# Patient Record
Sex: Male | Born: 1975 | Race: White | Hispanic: No | Smoking: Current every day smoker
Health system: Southern US, Community
[De-identification: ages and names within clinical notes are randomized; demographics above are authoritative.]

## PROBLEM LIST (undated history)

## (undated) DIAGNOSIS — I10 Essential (primary) hypertension: Secondary | ICD-10-CM

## (undated) DIAGNOSIS — I639 Cerebral infarction, unspecified: Secondary | ICD-10-CM

---

## 2012-10-07 ENCOUNTER — Other Ambulatory Visit: Payer: Self-pay | Admitting: Physician Assistant

## 2013-06-16 ENCOUNTER — Emergency Department: Payer: Self-pay | Admitting: Emergency Medicine

## 2013-06-16 LAB — URINALYSIS, COMPLETE
BACTERIA: NONE SEEN
Bilirubin,UR: NEGATIVE
Blood: NEGATIVE
GLUCOSE, UR: NEGATIVE mg/dL (ref 0–75)
Leukocyte Esterase: NEGATIVE
Nitrite: NEGATIVE
PROTEIN: NEGATIVE
Ph: 5 (ref 4.5–8.0)
RBC,UR: 1 /HPF (ref 0–5)
Specific Gravity: 1.028 (ref 1.003–1.030)
Squamous Epithelial: NONE SEEN
WBC UR: 1 /HPF (ref 0–5)

## 2013-06-16 LAB — COMPREHENSIVE METABOLIC PANEL
ALT: 26 U/L (ref 12–78)
ANION GAP: 8 (ref 7–16)
Albumin: 4.8 g/dL (ref 3.4–5.0)
Alkaline Phosphatase: 69 U/L
BUN: 12 mg/dL (ref 7–18)
Bilirubin,Total: 0.6 mg/dL (ref 0.2–1.0)
CHLORIDE: 101 mmol/L (ref 98–107)
Calcium, Total: 9.5 mg/dL (ref 8.5–10.1)
Co2: 22 mmol/L (ref 21–32)
Creatinine: 1.07 mg/dL (ref 0.60–1.30)
EGFR (African American): >60
Glucose: 104 mg/dL — ABNORMAL HIGH (ref 65–99)
Osmolality: 263 (ref 275–301)
Potassium: 4.7 mmol/L (ref 3.5–5.1)
SGOT(AST): 23 U/L (ref 15–37)
SODIUM: 131 mmol/L — AB (ref 136–145)
Total Protein: 9.1 g/dL — ABNORMAL HIGH (ref 6.4–8.2)

## 2013-06-16 LAB — CBC
HCT: 48 % (ref 40.0–52.0)
HGB: 16.6 g/dL (ref 13.0–18.0)
MCH: 31.7 pg (ref 26.0–34.0)
MCHC: 34.6 g/dL (ref 32.0–36.0)
MCV: 92 fL (ref 80–100)
Platelet: 332 10*3/uL (ref 150–440)
RBC: 5.24 10*6/uL (ref 4.40–5.90)
RDW: 13.7 % (ref 11.5–14.5)
WBC: 11.1 10*3/uL — AB (ref 3.8–10.6)

## 2013-06-16 LAB — PROTIME-INR
INR: 1
PROTHROMBIN TIME: 13.1 s (ref 11.5–14.7)

## 2013-06-16 LAB — LIPASE, BLOOD: LIPASE: 75 U/L (ref 73–393)

## 2013-07-05 ENCOUNTER — Ambulatory Visit
Admission: RE | Admit: 2013-07-05 | Discharge: 2013-07-05 | Disposition: A | Discharge status: Home / Self Care | Payer: No Typology Code available for payment source | Source: Ambulatory Visit | Attending: *Deleted | Admitting: *Deleted

## 2013-07-05 ENCOUNTER — Other Ambulatory Visit: Payer: Self-pay | Admitting: *Deleted

## 2013-07-05 DIAGNOSIS — R7611 Nonspecific reaction to tuberculin skin test without active tuberculosis: Secondary | ICD-10-CM

## 2018-03-18 ENCOUNTER — Encounter (HOSPITAL_COMMUNITY): Payer: Self-pay

## 2018-03-18 ENCOUNTER — Inpatient Hospital Stay (HOSPITAL_COMMUNITY)
Admission: AD | Admit: 2018-03-18 | Discharge: 2018-03-19 | DRG: 064 | Disposition: A | Discharge status: Home / Self Care | Payer: 59 | Source: Other Acute Inpatient Hospital | Attending: Neurology | Admitting: Neurology

## 2018-03-18 ENCOUNTER — Other Ambulatory Visit: Payer: Self-pay

## 2018-03-18 ENCOUNTER — Ambulatory Visit (HOSPITAL_COMMUNITY)
Admission: RE | Admit: 2018-03-18 | Discharge: 2018-03-18 | Disposition: A | Discharge status: Home / Self Care | Payer: 59 | Source: Ambulatory Visit | Attending: Student in an Organized Health Care Education/Training Program | Admitting: Student in an Organized Health Care Education/Training Program

## 2018-03-18 ENCOUNTER — Encounter (HOSPITAL_COMMUNITY)
Admission: AD | Disposition: A | Discharge status: Home / Self Care | Payer: Self-pay | Source: Other Acute Inpatient Hospital | Attending: Student in an Organized Health Care Education/Training Program

## 2018-03-18 ENCOUNTER — Inpatient Hospital Stay (HOSPITAL_COMMUNITY): Payer: 59 | Admitting: Certified Registered"

## 2018-03-18 ENCOUNTER — Other Ambulatory Visit (HOSPITAL_COMMUNITY): Payer: Self-pay | Admitting: Student in an Organized Health Care Education/Training Program

## 2018-03-18 ENCOUNTER — Encounter: Payer: Self-pay | Admitting: Medical Oncology

## 2018-03-18 ENCOUNTER — Emergency Department: Payer: Commercial Managed Care - HMO

## 2018-03-18 ENCOUNTER — Other Ambulatory Visit (HOSPITAL_COMMUNITY): Payer: 59

## 2018-03-18 ENCOUNTER — Emergency Department
Admission: EM | Admit: 2018-03-18 | Discharge: 2018-03-18 | Disposition: A | Discharge status: Other Acute Inpatient Hospital | Payer: Commercial Managed Care - HMO | Attending: Emergency Medicine | Admitting: Emergency Medicine

## 2018-03-18 DIAGNOSIS — I749 Embolism and thrombosis of unspecified artery: Secondary | ICD-10-CM | POA: Diagnosis not present

## 2018-03-18 DIAGNOSIS — I63231 Cerebral infarction due to unspecified occlusion or stenosis of right carotid arteries: Secondary | ICD-10-CM

## 2018-03-18 DIAGNOSIS — I6389 Other cerebral infarction: Secondary | ICD-10-CM | POA: Diagnosis not present

## 2018-03-18 DIAGNOSIS — F1410 Cocaine abuse, uncomplicated: Secondary | ICD-10-CM | POA: Diagnosis present

## 2018-03-18 DIAGNOSIS — Z823 Family history of stroke: Secondary | ICD-10-CM | POA: Diagnosis not present

## 2018-03-18 DIAGNOSIS — F1210 Cannabis abuse, uncomplicated: Secondary | ICD-10-CM | POA: Diagnosis present

## 2018-03-18 DIAGNOSIS — I639 Cerebral infarction, unspecified: Secondary | ICD-10-CM

## 2018-03-18 DIAGNOSIS — R29700 NIHSS score 0: Secondary | ICD-10-CM | POA: Diagnosis present

## 2018-03-18 DIAGNOSIS — H538 Other visual disturbances: Secondary | ICD-10-CM | POA: Insufficient documentation

## 2018-03-18 DIAGNOSIS — I63411 Cerebral infarction due to embolism of right middle cerebral artery: Secondary | ICD-10-CM | POA: Diagnosis present

## 2018-03-18 DIAGNOSIS — I7771 Dissection of carotid artery: Secondary | ICD-10-CM | POA: Diagnosis present

## 2018-03-18 DIAGNOSIS — I10 Essential (primary) hypertension: Secondary | ICD-10-CM | POA: Diagnosis present

## 2018-03-18 DIAGNOSIS — I739 Peripheral vascular disease, unspecified: Secondary | ICD-10-CM | POA: Diagnosis present

## 2018-03-18 DIAGNOSIS — R51 Headache: Secondary | ICD-10-CM | POA: Diagnosis present

## 2018-03-18 DIAGNOSIS — I6340 Cerebral infarction due to embolism of unspecified cerebral artery: Secondary | ICD-10-CM

## 2018-03-18 DIAGNOSIS — E785 Hyperlipidemia, unspecified: Secondary | ICD-10-CM | POA: Diagnosis present

## 2018-03-18 DIAGNOSIS — G453 Amaurosis fugax: Secondary | ICD-10-CM | POA: Diagnosis present

## 2018-03-18 DIAGNOSIS — F17210 Nicotine dependence, cigarettes, uncomplicated: Secondary | ICD-10-CM | POA: Diagnosis present

## 2018-03-18 DIAGNOSIS — F17200 Nicotine dependence, unspecified, uncomplicated: Secondary | ICD-10-CM | POA: Diagnosis not present

## 2018-03-18 HISTORY — PX: RADIOLOGY WITH ANESTHESIA: SHX6223

## 2018-03-18 HISTORY — PX: IR ANGIO VERTEBRAL SEL VERTEBRAL BILAT MOD SED: IMG5369

## 2018-03-18 HISTORY — PX: IR ANGIO INTRA EXTRACRAN SEL COM CAROTID INNOMINATE BILAT MOD SED: IMG5360

## 2018-03-18 HISTORY — DX: Cerebral infarction, unspecified: I63.9

## 2018-03-18 LAB — RAPID URINE DRUG SCREEN, HOSP PERFORMED
Amphetamines: NOT DETECTED
Barbiturates: NOT DETECTED
Benzodiazepines: POSITIVE — AB
Cocaine: POSITIVE — AB
Opiates: NOT DETECTED
Tetrahydrocannabinol: POSITIVE — AB

## 2018-03-18 LAB — BASIC METABOLIC PANEL
Anion gap: 13 (ref 5–15)
BUN: 9 mg/dL (ref 6–20)
CHLORIDE: 100 mmol/L (ref 98–111)
CO2: 26 mmol/L (ref 22–32)
CREATININE: 0.91 mg/dL (ref 0.61–1.24)
Calcium: 9.3 mg/dL (ref 8.9–10.3)
GFR calc Af Amer: >60 mL/min (ref >60)
GFR calc non Af Amer: >60 mL/min (ref >60)
GLUCOSE: 91 mg/dL (ref 70–99)
POTASSIUM: 3.9 mmol/L (ref 3.5–5.1)
Sodium: 139 mmol/L (ref 135–145)

## 2018-03-18 LAB — MRSA PCR SCREENING: MRSA by PCR: NEGATIVE

## 2018-03-18 LAB — CBC WITH DIFFERENTIAL/PLATELET
ABS IMMATURE GRANULOCYTES: 0.01 10*3/uL (ref 0.00–0.07)
Basophils Absolute: 0 10*3/uL (ref 0.0–0.1)
Basophils Relative: 1 %
EOS PCT: 3 %
Eosinophils Absolute: 0.2 10*3/uL (ref 0.0–0.5)
HEMATOCRIT: 45.8 % (ref 39.0–52.0)
HEMOGLOBIN: 15.8 g/dL (ref 13.0–17.0)
Immature Granulocytes: 0 %
LYMPHS ABS: 1.4 10*3/uL (ref 0.7–4.0)
LYMPHS PCT: 26 %
MCH: 32 pg (ref 26.0–34.0)
MCHC: 34.5 g/dL (ref 30.0–36.0)
MCV: 92.7 fL (ref 80.0–100.0)
MONO ABS: 0.5 10*3/uL (ref 0.1–1.0)
MONOS PCT: 8 %
NEUTROS ABS: 3.4 10*3/uL (ref 1.7–7.7)
Neutrophils Relative %: 62 %
Platelets: 280 10*3/uL (ref 150–400)
RBC: 4.94 MIL/uL (ref 4.22–5.81)
RDW: 13.4 % (ref 11.5–15.5)
WBC: 5.5 10*3/uL (ref 4.0–10.5)
nRBC: 0 % (ref 0.0–0.2)

## 2018-03-18 SURGERY — IR WITH ANESTHESIA
Anesthesia: Monitor Anesthesia Care

## 2018-03-18 MED ORDER — ACETAMINOPHEN 325 MG PO TABS: 650.0000 mg | ORAL_TABLET | ORAL | Status: DC | PRN

## 2018-03-18 MED ORDER — SENNOSIDES-DOCUSATE SODIUM 8.6-50 MG PO TABS: 1.0000 | ORAL_TABLET | Freq: Every evening | ORAL | Status: DC | PRN

## 2018-03-18 MED ORDER — DIPHENHYDRAMINE HCL 50 MG/ML IJ SOLN
50.0000 mg | Freq: Once | INTRAMUSCULAR | Status: AC
  Administered 2018-03-18: 50 mg via INTRAVENOUS
  Filled 2018-03-18: qty 1

## 2018-03-18 MED ORDER — MIDAZOLAM HCL 2 MG/2ML IJ SOLN
INTRAMUSCULAR | Status: DC | PRN
  Administered 2018-03-18: 1 mg via INTRAVENOUS

## 2018-03-18 MED ORDER — FENTANYL CITRATE (PF) 100 MCG/2ML IJ SOLN
INTRAMUSCULAR | Status: DC | PRN
  Administered 2018-03-18: 50 ug via INTRAVENOUS

## 2018-03-18 MED ORDER — SODIUM CHLORIDE 0.9 % IV BOLUS
1000.0000 mL | Freq: Once | INTRAVENOUS | Status: AC
  Administered 2018-03-18: 1000 mL via INTRAVENOUS

## 2018-03-18 MED ORDER — METHYLPREDNISOLONE SODIUM SUCC 125 MG IJ SOLR
125.0000 mg | Freq: Once | INTRAMUSCULAR | Status: AC
  Administered 2018-03-18: 125 mg via INTRAVENOUS
  Filled 2018-03-18: qty 2

## 2018-03-18 MED ORDER — ACETAMINOPHEN 650 MG RE SUPP: 650.0000 mg | RECTAL | Status: DC | PRN

## 2018-03-18 MED ORDER — NICOTINE 21 MG/24HR TD PT24: 21.0000 mg | MEDICATED_PATCH | Freq: Every day | TRANSDERMAL | Status: DC

## 2018-03-18 MED ORDER — ASPIRIN EC 325 MG PO TBEC
325.0000 mg | DELAYED_RELEASE_TABLET | Freq: Every day | ORAL | Status: DC
  Administered 2018-03-19: 325 mg via ORAL
  Filled 2018-03-18: qty 1

## 2018-03-18 MED ORDER — SODIUM CHLORIDE 0.9 % IV SOLN: INTRAVENOUS | Status: AC

## 2018-03-18 MED ORDER — LIDOCAINE HCL 1 % IJ SOLN
INTRAMUSCULAR | Status: AC
  Filled 2018-03-18: qty 20

## 2018-03-18 MED ORDER — SODIUM CHLORIDE 0.9 % IV SOLN
INTRAVENOUS | Status: DC
  Administered 2018-03-18 – 2018-03-19 (×2): via INTRAVENOUS

## 2018-03-18 MED ORDER — CLOPIDOGREL BISULFATE 75 MG PO TABS
300.0000 mg | ORAL_TABLET | Freq: Once | ORAL | Status: AC
  Administered 2018-03-18: 300 mg via ORAL
  Filled 2018-03-18: qty 4

## 2018-03-18 MED ORDER — HEPARIN SODIUM (PORCINE) 5000 UNIT/ML IJ SOLN
5000.0000 [IU] | Freq: Three times a day (TID) | INTRAMUSCULAR | Status: DC
  Administered 2018-03-18 – 2018-03-19 (×4): 5000 [IU] via SUBCUTANEOUS
  Filled 2018-03-18 (×4): qty 1

## 2018-03-18 MED ORDER — HEPARIN SODIUM (PORCINE) 1000 UNIT/ML IJ SOLN
INTRAMUSCULAR | Status: DC | PRN
  Administered 2018-03-18: 1000 [IU] via INTRAVENOUS

## 2018-03-18 MED ORDER — NICOTINE 21 MG/24HR TD PT24
21.0000 mg | MEDICATED_PATCH | Freq: Every day | TRANSDERMAL | Status: DC
  Administered 2018-03-18 – 2018-03-19 (×2): 21 mg via TRANSDERMAL
  Filled 2018-03-18 (×2): qty 1

## 2018-03-18 MED ORDER — ACETAMINOPHEN 160 MG/5ML PO SOLN: 650.0000 mg | ORAL | Status: DC | PRN

## 2018-03-18 MED ORDER — ASPIRIN 325 MG PO TABS
650.0000 mg | ORAL_TABLET | Freq: Once | ORAL | Status: AC
  Administered 2018-03-18: 650 mg via ORAL
  Filled 2018-03-18: qty 2

## 2018-03-18 MED ORDER — IOHEXOL 350 MG/ML SOLN
75.0000 mL | Freq: Once | INTRAVENOUS | Status: AC | PRN
  Administered 2018-03-18: 75 mL via INTRAVENOUS

## 2018-03-18 MED ORDER — SODIUM CHLORIDE 0.9 % IV SOLN: INTRAVENOUS | Status: DC

## 2018-03-18 MED ORDER — STROKE: EARLY STAGES OF RECOVERY BOOK
Freq: Once | Status: DC
  Filled 2018-03-18: qty 1

## 2018-03-18 MED ORDER — METOCLOPRAMIDE HCL 5 MG/ML IJ SOLN
10.0000 mg | Freq: Once | INTRAMUSCULAR | Status: AC
  Administered 2018-03-18: 10 mg via INTRAVENOUS
  Filled 2018-03-18: qty 2

## 2018-03-18 MED ORDER — ASPIRIN 325 MG PO TABS: 650.0000 mg | ORAL_TABLET | Freq: Every day | ORAL | Status: DC

## 2018-03-18 MED ORDER — SODIUM CHLORIDE 0.9 % IV SOLN
INTRAVENOUS | Status: DC | PRN
  Administered 2018-03-18: 15:00:00 via INTRAVENOUS

## 2018-03-18 MED ORDER — CLOPIDOGREL BISULFATE 75 MG PO TABS
75.0000 mg | ORAL_TABLET | Freq: Every day | ORAL | Status: DC
  Administered 2018-03-19: 75 mg via ORAL
  Filled 2018-03-18: qty 1

## 2018-03-18 NOTE — Progress Notes (Signed)
Dr. Wilford Corner wanted the pt to have ASA & Plavix now and said it was OK to perform the [facility] Screening even though pt cannot sit up until 1900. Pt passed and received both medications.

## 2018-03-18 NOTE — ED Notes (Addendum)
Pt to and from CT. ABCs intact. NAD. Pt placed on 10L via NRB for oxygen therapy for HA.

## 2018-03-18 NOTE — Procedures (Signed)
S/P 4 vessel cerebral arteriogram. RT CFA approach. Findings . 1. 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 2.RT MCA and RT ACA collateralized from the Lt ICA via ACOM and via RT  PCOM from the verts bilaterally.

## 2018-03-18 NOTE — Code Documentation (Signed)
42yo male arriving to [facility] at 68 via [facility]. Patient transferred from [facility] ED as a code stroke. Patient with right visual loss and right sided weakness yesterday at 1830 that lasted for 5 minutes and resolved. He reports having a headache after and going to bed. He woke up this morning with worsening headache and presented to [facility] ED. 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 The right internal carotid artery is reconstituted at the skull base. Decreased intensity of contrast suggesting decreased perfusion within the right internal carotid artery at the skull base through the ICA terminus. Partial filling defect of the distal right M1 segment extending into the anterior right M2 segment compatible with thrombus. Neurology at [facility] consulted and patient transferred to [facility] as a code stroke for endovascular intervention. Patient assessed on arrival to IR and NIHSS is 0. Patient will undergo a diagnostic catheter angiogram and be admitted to the ICU. Bedside handoff with IR RN.

## 2018-03-18 NOTE — Transfer of Care (Signed)
Immediate Anesthesia Transfer of Care Note  Patient: Robert Jacobson  Procedure(s) Performed: IR WITH ANESTHESIA (N/A )  Patient Location: ICU  Anesthesia Type:MAC  Level of Consciousness: awake, alert  and oriented  Airway & Oxygen Therapy: Patient Spontanous Breathing  Post-op Assessment: Report given to RN  Post vital signs: Reviewed and stable  Last Vitals:  Vitals Value Taken Time  BP 144/101 03/18/2018  4:30 PM  Temp    Pulse 88 03/18/2018  4:30 PM  Resp    SpO2 100 % 03/18/2018  4:30 PM  Vitals shown include unvalidated device data.  Last Pain:  Vitals:         Complications: No apparent anesthesia complications

## 2018-03-18 NOTE — Anesthesia Preprocedure Evaluation (Addendum)
Anesthesia Evaluation  Patient identified by MRN, date of birth, ID band Patient awake    Reviewed: Allergy & Precautions, NPO status , Patient's Chart, lab work & pertinent test results  History of Anesthesia Complications Negative for: history of anesthetic complications  Airway Mallampati: I  TM Distance: >3 FB Neck ROM: Full    Dental  (+) Partial Upper, Partial Lower, Poor Dentition, Chipped, Missing, Dental Advisory Given,    Pulmonary neg pulmonary ROS,    breath sounds clear to auscultation       Cardiovascular + Peripheral Vascular Disease   Rhythm:Regular Rate:Normal     Neuro/Psych  '19 CT Neck - IMPRESSION: 1. 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 2. The right internal carotid artery is reconstituted at the skull base. This may represent retrograde flow from a more distal reconstitution. 3. Decreased intensity of contrast suggesting decreased perfusion within the right internal carotid artery at the skull base through the ICA terminus. 4. Partial filling defect of the distal right M1 segment extending into the anterior right M2 segment compatible with thrombus. 5. No emergent large vessel occlusion. 6. Left MCA and bilateral ACA distributions are unremarkable. 7. No acute infarct.  TIAnegative psych ROS   GI/Hepatic negative GI ROS, (+)     substance abuse  marijuana use,   Endo/Other  negative endocrine ROS  Renal/GU negative Renal ROS  negative genitourinary   Musculoskeletal negative musculoskeletal ROS (+)   Abdominal   Peds  Hematology negative hematology ROS (+)   Anesthesia Other Findings   Reproductive/Obstetrics                           Anesthesia Physical Anesthesia Plan  ASA: III and emergent  Anesthesia Plan: MAC   Post-op Pain Management:    Induction: Intravenous  PONV Risk Score and Plan: 1 and Propofol infusion  and Treatment may vary due to age or medical condition  Airway Management Planned: Nasal Cannula and Natural Airway  Additional Equipment:   Intra-op Plan:   Post-operative Plan:   Informed Consent: I have reviewed the patients History and Physical, chart, labs and discussed the procedure including the risks, benefits and alternatives for the proposed anesthesia with the patient or authorized representative who has indicated his/her understanding and acceptance.   Dental advisory given  Plan Discussed with: CRNA and Anesthesiologist  Anesthesia Plan Comments: (MAC for diagnostic procedure, convert to GETA as needed and place A-line)       Anesthesia Quick Evaluation

## 2018-03-18 NOTE — ED Notes (Addendum)
Attempted to call report to [facility] as pt is going to IR then 4N room 18 at [phone], no answer.   1445 Attempted again to call report to number above, no answer.

## 2018-03-18 NOTE — ED Provider Notes (Signed)
[facility] Emergency Department Provider Note  ____________________________________________  Time seen: Approximately 1:34 PM  I have reviewed the triage vital signs and the nursing notes.   HISTORY  Chief Complaint Headache; Numbness; and Blurred Vision    HPI Robert Jacobson is a 42 y.o. male with no significant past medical history who was in his usual state of health yesterday until 6 PM week when he had a sudden onset of right-sided headache that was associated with partial vision loss of the right eye and right sided weakness.   He tried to stand up but fell down and could not lift himself up.  Symptoms went on for about 3 minutes and then resolved.  He currently denies any weakness paresthesias numbness or vision changes, but he does have constant severe headache that radiates to the left frontal area as well.  No aggravating or alleviating factors, not positional, not affected by turning his head.  Denies any recent trauma or chiropractic manipulation.  Never had anything like this before.     History reviewed. No pertinent past medical history.   There are no active problems to display for this patient.    History reviewed. No pertinent surgical history.   Prior to Admission medications   Not on File     Allergies Patient has no known allergies.   No family history on file.  Social History Social History   Tobacco Use  . Smoking status: Not on file  Substance Use Topics  . Alcohol use: Not on file  . Drug use: Not on file    Review of Systems  Constitutional:   No fever or chills.  Cardiovascular:   No chest pain or syncope. Respiratory:   No dyspnea or cough. Gastrointestinal:   Negative for abdominal pain, vomiting and diarrhea.  Musculoskeletal:   Negative for focal pain or swelling All other systems reviewed and are negative except as documented above in ROS and  HPI.  ____________________________________________   PHYSICAL EXAM:  VITAL SIGNS: ED Triage Vitals [03/18/18 1136]  Enc Vitals Group     BP (!) 167/101     Pulse Rate 81     Resp 18     Temp 98.3 F (36.8 C)     Temp Source Oral     SpO2 99 %     Weight 160 lb (72.6 kg)     Height 5\' 11"  (1.803 m)     Head Circumference      Peak Flow      Pain Score 10     Pain Loc      Pain Edu?      Excl. in GC?     Vital signs reviewed, nursing assessments reviewed.   Constitutional:   Alert and oriented. Non-toxic appearance. Eyes:   Conjunctivae are normal. EOMI. PERRL. ENT      Head:   Normocephalic and atraumatic.      Nose:   No congestion/rhinnorhea.       Mouth/Throat:   MMM, no pharyngeal erythema. No peritonsillar mass.       Neck:   No meningismus. Full ROM. Hematological/Lymphatic/Immunilogical:   No cervical lymphadenopathy. Cardiovascular:   RRR. Symmetric bilateral radial and DP pulses.  No murmurs. Cap refill less than 2 seconds. Respiratory:   Normal respiratory effort without tachypnea/retractions. Breath sounds are clear and equal bilaterally. No wheezes/rales/rhonchi. Gastrointestinal:   Soft and nontender. Non distended. There is no CVA tenderness.  No rebound, rigidity, or guarding. Musculoskeletal:  Normal range of motion in all extremities. No joint effusions.  No lower extremity tenderness.  No edema. Neurologic:   Normal speech and language.  Motor grossly intact. Cranial nerves III through XII intact Sensation intact No pronator drift.  Normal finger-to-nose.  Normal heel-to-shin NIH stroke scale 0 No acute focal neurologic deficits are appreciated.  Skin:    Skin is warm, dry and intact. No rash noted.  No petechiae, purpura, or bullae.  ____________________________________________    LABS (pertinent positives/negatives) (all labs ordered are listed, but only abnormal results are displayed) Labs Reviewed  BASIC METABOLIC PANEL  CBC WITH  DIFFERENTIAL/PLATELET   ____________________________________________   EKG Interpreted by me Sinus rhythm rate of 79, normal axis and intervals.  Normal QRS ST segments and T waves.  Slight J-point elevation in anteroseptal leads, nonischemic in appearance.   ____________________________________________    RADIOLOGY  Ct Angio Head W Or Wo Contrast  Result Date: 03/18/2018 CLINICAL DATA:  Headache, acute, severe, worst headache of life. Episode of right-sided weakness and right visual loss lasting 3 minutes which has since resolved. EXAM: CT ANGIOGRAPHY HEAD AND NECK TECHNIQUE: Multidetector CT imaging of the head and neck was performed using the standard protocol during bolus administration of intravenous contrast. Multiplanar CT image reconstructions and MIPs were obtained to evaluate the vascular anatomy. Carotid stenosis measurements (when applicable) are obtained utilizing NASCET criteria, using the distal internal carotid diameter as the denominator. CONTRAST:  75mL OMNIPAQUE IOHEXOL 350 MG/ML SOLN COMPARISON:  None. FINDINGS: CT HEAD FINDINGS Brain: No acute cortical infarct, hemorrhage, or mass lesion is present. The ventricles are of normal size. Basal ganglia are intact. Insular ribbon is normal. No significant white matter disease is present. The brainstem and cerebellum are normal. Vascular: A hyperdense right MCA is present. No significant vascular calcifications are present. Skull: Calvarium is intact. No focal lytic or blastic lesions are present. Sinuses: The paranasal sinuses and mastoid air cells are clear. Orbits: Globes and orbits are within normal limits. Review of the MIP images confirms the above findings CTA NECK FINDINGS Aortic arch: A 3 vessel arch configuration is present. No significant vascular disease or stenosis is present. Right carotid system: The right common carotid artery is within normal limits. Bifurcation is unremarkable. The proximal right ICA is decreased in  caliber compared to the left. There is focal irregularity just below the skull base with marked tapering compatible with focal dissection. The right ICA is tortuous. Left carotid system: The left common carotid artery is within normal limits. Bifurcation is unremarkable. The cervical left ICA is moderately tortuous without focal stenosis or vascular injury. Vertebral arteries: The vertebral arteries are codominant. Both vertebral arteries originate from the subclavian arteries without significant stenosis. There is no focal stenosis or injury to either vertebral artery in the neck. Skeleton: Vertebral body heights alignment are maintained. No focal lytic or blastic lesions are present. Mild endplate degenerative changes are present at C3-4. Other neck: The soft tissues of the neck are unremarkable. Upper chest: Focal early centrilobular emphysematous changes are suggested. Upper mediastinum and thoracic inlet are within normal limits. Review of the MIP images confirms the above findings CTA HEAD FINDINGS Anterior circulation: There is marked attenuation of the right internal carotid artery at the skull base and through the cavernous segment. Contrast is more robust in the right A1 segment. There is a partial filling defect in the distal right M1 segment through the right MCA bifurcation. The right MCA branch vessels are intact. The partial filling  defect extends into the anterior M2 branch. Left MCA branch vessels and bilateral ACA branch vessels are within normal limits. The left internal carotid artery, left A1 and M1 segments are normal. Posterior circulation: Vertebral arteries are codominant. PICA origins are visualized and normal. Basilar artery is normal. Both posterior cerebral arteries originate from the basilar tip. PCA branch vessels are normal. Venous sinuses: The dural sinuses are patent. The right transverse sinus is dominant. Straight sinus deep cerebral veins are intact. Cortical veins are within  normal limits. Anatomic variants: None Delayed phase: Postcontrast images demonstrate no pathologic enhancement or infarct. Review of the MIP images confirms the above findings IMPRESSION: 1. 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 2. The right internal carotid artery is reconstituted at the skull base. This may represent retrograde flow from a more distal reconstitution. 3. Decreased intensity of contrast suggesting decreased perfusion within the right internal carotid artery at the skull base through the ICA terminus. 4. Partial filling defect of the distal right M1 segment extending into the anterior right M2 segment compatible with thrombus. 5. No emergent large vessel occlusion. 6. Left MCA and bilateral ACA distributions are unremarkable. 7. No acute infarct. These results were called by telephone at the time of interpretation on 03/18/2018 at 12:40 pm to Dr. Sharman Cheek , who verbally acknowledged these results. Electronically Signed   By: Marin Roberts M.D.   On: 03/18/2018 12:49   Ct Angio Neck W And/or Wo Contrast  Result Date: 03/18/2018 CLINICAL DATA:  Headache, acute, severe, worst headache of life. Episode of right-sided weakness and right visual loss lasting 3 minutes which has since resolved. EXAM: CT ANGIOGRAPHY HEAD AND NECK TECHNIQUE: Multidetector CT imaging of the head and neck was performed using the standard protocol during bolus administration of intravenous contrast. Multiplanar CT image reconstructions and MIPs were obtained to evaluate the vascular anatomy. Carotid stenosis measurements (when applicable) are obtained utilizing NASCET criteria, using the distal internal carotid diameter as the denominator. CONTRAST:  75mL OMNIPAQUE IOHEXOL 350 MG/ML SOLN COMPARISON:  None. FINDINGS: CT HEAD FINDINGS Brain: No acute cortical infarct, hemorrhage, or mass lesion is present. The ventricles are of normal size. Basal ganglia are intact. Insular  ribbon is normal. No significant white matter disease is present. The brainstem and cerebellum are normal. Vascular: A hyperdense right MCA is present. No significant vascular calcifications are present. Skull: Calvarium is intact. No focal lytic or blastic lesions are present. Sinuses: The paranasal sinuses and mastoid air cells are clear. Orbits: Globes and orbits are within normal limits. Review of the MIP images confirms the above findings CTA NECK FINDINGS Aortic arch: A 3 vessel arch configuration is present. No significant vascular disease or stenosis is present. Right carotid system: The right common carotid artery is within normal limits. Bifurcation is unremarkable. The proximal right ICA is decreased in caliber compared to the left. There is focal irregularity just below the skull base with marked tapering compatible with focal dissection. The right ICA is tortuous. Left carotid system: The left common carotid artery is within normal limits. Bifurcation is unremarkable. The cervical left ICA is moderately tortuous without focal stenosis or vascular injury. Vertebral arteries: The vertebral arteries are codominant. Both vertebral arteries originate from the subclavian arteries without significant stenosis. There is no focal stenosis or injury to either vertebral artery in the neck. Skeleton: Vertebral body heights alignment are maintained. No focal lytic or blastic lesions are present. Mild endplate degenerative changes are present at C3-4. Other  neck: The soft tissues of the neck are unremarkable. Upper chest: Focal early centrilobular emphysematous changes are suggested. Upper mediastinum and thoracic inlet are within normal limits. Review of the MIP images confirms the above findings CTA HEAD FINDINGS Anterior circulation: There is marked attenuation of the right internal carotid artery at the skull base and through the cavernous segment. Contrast is more robust in the right A1 segment. There is a  partial filling defect in the distal right M1 segment through the right MCA bifurcation. The right MCA branch vessels are intact. The partial filling defect extends into the anterior M2 branch. Left MCA branch vessels and bilateral ACA branch vessels are within normal limits. The left internal carotid artery, left A1 and M1 segments are normal. Posterior circulation: Vertebral arteries are codominant. PICA origins are visualized and normal. Basilar artery is normal. Both posterior cerebral arteries originate from the basilar tip. PCA branch vessels are normal. Venous sinuses: The dural sinuses are patent. The right transverse sinus is dominant. Straight sinus deep cerebral veins are intact. Cortical veins are within normal limits. Anatomic variants: None Delayed phase: Postcontrast images demonstrate no pathologic enhancement or infarct. Review of the MIP images confirms the above findings IMPRESSION: 1. 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 2. The right internal carotid artery is reconstituted at the skull base. This may represent retrograde flow from a more distal reconstitution. 3. Decreased intensity of contrast suggesting decreased perfusion within the right internal carotid artery at the skull base through the ICA terminus. 4. Partial filling defect of the distal right M1 segment extending into the anterior right M2 segment compatible with thrombus. 5. No emergent large vessel occlusion. 6. Left MCA and bilateral ACA distributions are unremarkable. 7. No acute infarct. These results were called by telephone at the time of interpretation on 03/18/2018 at 12:40 pm to Dr. Sharman Cheek , who verbally acknowledged these results. Electronically Signed   By: Marin Roberts M.D.   On: 03/18/2018 12:49    ____________________________________________   PROCEDURES .Critical Care Performed by: Sharman Cheek, MD Authorized by: Sharman Cheek, MD   Critical  care provider statement:    Critical care time (minutes):  35   Critical care time was exclusive of:  Separately billable procedures and treating other patients   Critical care was necessary to treat or prevent imminent or life-threatening deterioration of the following conditions:  CNS failure or compromise   Critical care was time spent personally by me on the following activities:  Development of treatment plan with patient or surrogate, discussions with consultants, evaluation of patient's response to treatment, examination of patient, obtaining history from patient or surrogate, ordering and performing treatments and interventions, ordering and review of laboratory studies, ordering and review of radiographic studies, pulse oximetry, re-evaluation of patient's condition and review of old charts    ____________________________________________  DIFFERENTIAL DIAGNOSIS   Cerebral aneurysm, intracranial hemorrhage, carotid dissection, stroke/TIA, cluster headache  CLINICAL IMPRESSION / ASSESSMENT AND PLAN / ED COURSE  Pertinent labs & imaging results that were available during my care of the patient were reviewed by me and considered in my medical decision making (see chart for details).    Patient presents with sudden severe headache associated with neuro deficits.  Differential is concerning, will obtain CT angiogram of the head and neck immediately.  Oxygen, steroids Benadryl Reglan for pain control.  Clinical Course as of Mar 19 1423  Fri Mar 18, 2018  1241 CTA d/w radiologists. Reveals thrombus in ICA  and MCA causing partial occlusion. Will d/w neuro.  Currently neuro intact with NIHSS 0   [PS]  1324 D/w neuro Dr. Thad Ranger - rec transfer to higher level of care neuro service. Pt prefers [facility]. Will d/w neuro hospitalist there.    [PS]  1345 D/w Neuro [facility] Dr. Laurence Slate, requests formal eval by Dr. Thad Ranger prior to considering transfer   [PS]  1414 D/w Dr. Laurence Slate, requests to  elevate case to IR code stroke with immediate transfer to [facility] IR suite for diagnostic / interventional IR procedure   [PS]    Clinical Course User Index [PS] Sharman Cheek, MD     ----------------------------------------- 2:24 PM on 03/18/2018 -----------------------------------------  Dr. Laurence Slate advises to hold off on aspirin or heparinization at this time given emergent transfer and impending IR procedure.  ____________________________________________   FINAL CLINICAL IMPRESSION(S) / ED DIAGNOSES    Final diagnoses:  Internal carotid artery dissection (HCC)  Arterial thrombosis (HCC)  Severe right-sided headache   ED Discharge Orders    None      Portions of this note were generated with dragon dictation software. Dictation errors may occur despite best attempts at proofreading.    Sharman Cheek, MD 03/18/18 [phone]

## 2018-03-18 NOTE — ED Notes (Signed)
MD to bedside with update 

## 2018-03-18 NOTE — Consult Note (Addendum)
Referring Physician:Stafford, Aneta Mins, MD    Chief Complaint: Left vision loss, headache and syncope  HPI: Robert Jacobson is an 42 y.o. male with significant history of smoking since age 53 and drug use presenting to the ED with chief complaint of right vision loss, syncope and headache.  Patient states that he was watching TV at around 6 PM when he suddenly lost half of his vision on the right side. Patient describes episode as painless loss of vision in the right eye without associated vertigo/dizziness. No symptoms of eye redness or pain and tearing associated with visual loss (intermittent angle closure glaucoma). Patient states nothing seem to precipitate episode such as postural changes or exercise, loss of vision when eyes are moved into certain positions of gaze (gaze-evoked amaurosis) or loss of vision after exercise or a hot shower (Uhthoff's symptom) to suggest demyelinating disease of the optic nerve. He got up to go to the bathroom so he can check what was wrong with his eye when he suddenly passed out.  He does not know how long he was on the floor but think it was about 2 minutes, when he finally woke up he had a headache on the right side. Denies associated symptoms preceding the syncope of aura, nausea and vomiting, feeling cold or clammy, visual auras or blurry vision, palpitations shortness of breath chest pain. He denies loss of bladder control or tongue biting.  Patient states that he went to bed and woke up the next morning with a very bad headache on the right side of his head which was worse than the previous day. Denies associated altered sensorium, speech abnormality, cranial nerve deficit, seizures, focal motor or sensory deficits, diplopia, or vomiting, ipsilateral or contralateral paralysis/weakness, numbness or tingling, involuntary movements, tremor. He denies history of head injury or trauma.  Patient admits that he occasionally uses drugs such as marijuana and cocaine.   The last time he used cocaine was about 3 to 4 days ago.  Smokes about 1 pack of cigarettes (20 cigarettes) per day since age 45.  Initial CTA head and neck showed distal cervical right ICA occlusion concerning for dissection, decreased perfusion within the right ICA the skull base through the ICA terminus, filling defect of the distal right M1 segment stenting into the anterior right M2 segment compatible with thrombus, otherwise no emergent large vessel occlusion.  MRI brain without contrast pending.   Date last known well: Date: 03/17/2018 Time last known well: Unable to determine tPA Given: No: Outside window.  History reviewed. No pertinent past medical history.  History reviewed. No pertinent surgical history.  Family History  Problem Relation Age of Onset  . Hypertension Mother   . Hypertension Father   . Stroke Maternal Grandmother   . Cerebral aneurysm Maternal Grandfather    Social History:  has no tobacco, alcohol, and drug history on file.  Allergies: No Known Allergies  Medications:  I have reviewed the patient's current medications. Prior to Admission:  (Not in a hospital admission) Scheduled: . nicotine  21 mg Transdermal Daily   ROS: History obtained from the patient   General ROS: negative for - chills, fatigue, fever, night sweats, weight gain or weight loss Psychological ROS: negative for - behavioral disorder, hallucinations, memory difficulties, mood swings or suicidal ideation Ophthalmic ROS: negative for - blurry vision, double vision, eye pain or loss of vision ENT ROS: negative for - epistaxis, nasal discharge, oral lesions, sore throat, tinnitus or vertigo Allergy and Immunology ROS: negative  for - hives or itchy/watery eyes Hematological and Lymphatic ROS: negative for - bleeding problems, bruising or swollen lymph nodes Endocrine ROS: negative for - galactorrhea, hair pattern changes, polydipsia/polyuria or temperature intolerance Respiratory ROS:  negative for - cough, hemoptysis, shortness of breath or wheezing Cardiovascular ROS: negative for - chest pain, dyspnea on exertion, edema or irregular heartbeat Gastrointestinal ROS: negative for - abdominal pain, diarrhea, hematemesis, nausea/vomiting or stool incontinence Genito-Urinary ROS: negative for - dysuria, hematuria, incontinence or urinary frequency/urgency Musculoskeletal ROS: negative for - joint swelling or muscular weakness Neurological ROS: as noted in HPI Dermatological ROS: negative for rash and skin lesion changes  Physical Examination: Blood pressure (!) 141/104, pulse 88, temperature 98 F (36.7 C), resp. rate 15, height 5\' 11"  (1.803 m), weight 72.6 kg, SpO2 98 %.   HEENT-  Normocephalic, no lesions, without obvious abnormality.  Normal external eye and conjunctiva.  Normal TM's bilaterally.  Normal auditory canals and external ears. Normal external nose, mucus membranes and septum.  Normal pharynx. Cardiovascular- S1, S2 normal, pulses palpable throughout   Lungs- chest clear, no wheezing, rales, normal symmetric air entry Abdomen- soft, non-tender; bowel sounds normal; no masses,  no organomegaly Extremities- no edema Lymph-no adenopathy palpable Musculoskeletal-no joint tenderness, deformity or swelling Skin-warm and dry, no hyperpigmentation, vitiligo, or suspicious lesions  Neurological Exam   Mental Status: Alert, oriented, thought content appropriate.  Speech fluent without evidence of aphasia.  Able to follow 3 step commands without difficulty. Attention span and concentration seemed appropriate  Cranial Nerves: II: Discs flat bilaterally; Visual fields cut on the right, pupils equal, round, reactive to light and accommodation III,IV, VI: ptosis not present, extra-ocular motions intact bilaterally V,VII: smile symmetric, facial light touch sensation intact VIII: hearing normal bilaterally IX,X: gag reflex present XI: bilateral shoulder shrug XII:  midline tongue extension Motor: Right :  Upper extremity   5/5 Without pronator drift      Left: Upper extremity   5/5 without pronator drift Right:   Lower extremity   5/5                                          Left: Lower extremity   5/5 Tone and bulk:normal tone throughout; no atrophy noted Sensory: Pinprick and light touch intact bilaterally Deep Tendon Reflexes: 2+ and symmetric throughout Plantars: Right:  Downgoing                              Left: Downgoing Cerebellar: Finger-to-nose testing intact bilaterally. Heel to shin testing normal bilaterally Gait: not tested due to safety concerns  Data Reviewed  Laboratory Studies:  Basic Metabolic Panel: Recent Labs  Lab 03/18/18 1147  NA 139  K 3.9  CL 100  CO2 26  GLUCOSE 91  BUN 9  CREATININE 0.91  CALCIUM 9.3    Liver Function Tests: No results for input(s): AST, ALT, ALKPHOS, BILITOT, PROT, ALBUMIN in the last 168 hours. No results for input(s): LIPASE, AMYLASE in the last 168 hours. No results for input(s): AMMONIA in the last 168 hours.  CBC: Recent Labs  Lab 03/18/18 1147  WBC 5.5  NEUTROABS 3.4  HGB 15.8  HCT 45.8  MCV 92.7  PLT 280    Cardiac Enzymes: No results for input(s): CKTOTAL, CKMB, CKMBINDEX, TROPONINI in the last 168 hours.  BNP: Invalid input(s):  POCBNP  CBG: No results for input(s): GLUCAP in the last 168 hours.  Microbiology: No results found for this or any previous visit.  Coagulation Studies: No results for input(s): LABPROT, INR in the last 72 hours.  Urinalysis: No results for input(s): COLORURINE, LABSPEC, PHURINE, GLUCOSEU, HGBUR, BILIRUBINUR, KETONESUR, PROTEINUR, UROBILINOGEN, NITRITE, LEUKOCYTESUR in the last 168 hours.  Invalid input(s): APPERANCEUR  Lipid Panel: No results found for: CHOL, TRIG, HDL, CHOLHDL, VLDL, LDLCALC  HgbA1C: No results found for: HGBA1C  Urine Drug Screen:  No results found for: LABOPIA, COCAINSCRNUR, LABBENZ, AMPHETMU, THCU, LABBARB   Alcohol Level: No results for input(s): ETH in the last 168 hours.  Other results: EKG: normal EKG, normal sinus rhythm, unchanged from previous tracings. Vent. rate 79 BPM PR interval * ms QRS duration 106 ms QT/QTc 387/444 ms P-R-T axes 60 76 71  Imaging: Ct Angio Head W Or Wo Contrast  Result Date: 03/18/2018 CLINICAL DATA:  Headache, acute, severe, worst headache of life. Episode of right-sided weakness and right visual loss lasting 3 minutes which has since resolved. EXAM: CT ANGIOGRAPHY HEAD AND NECK TECHNIQUE: Multidetector CT imaging of the head and neck was performed using the standard protocol during bolus administration of intravenous contrast. Multiplanar CT image reconstructions and MIPs were obtained to evaluate the vascular anatomy. Carotid stenosis measurements (when applicable) are obtained utilizing NASCET criteria, using the distal internal carotid diameter as the denominator. CONTRAST:  75mL OMNIPAQUE IOHEXOL 350 MG/ML SOLN COMPARISON:  None. FINDINGS: CT HEAD FINDINGS Brain: No acute cortical infarct, hemorrhage, or mass lesion is present. The ventricles are of normal size. Basal ganglia are intact. Insular ribbon is normal. No significant white matter disease is present. The brainstem and cerebellum are normal. Vascular: A hyperdense right MCA is present. No significant vascular calcifications are present. Skull: Calvarium is intact. No focal lytic or blastic lesions are present. Sinuses: The paranasal sinuses and mastoid air cells are clear. Orbits: Globes and orbits are within normal limits. Review of the MIP images confirms the above findings CTA NECK FINDINGS Aortic arch: A 3 vessel arch configuration is present. No significant vascular disease or stenosis is present. Right carotid system: The right common carotid artery is within normal limits. Bifurcation is unremarkable. The proximal right ICA is decreased in caliber compared to the left. There is focal irregularity just  below the skull base with marked tapering compatible with focal dissection. The right ICA is tortuous. Left carotid system: The left common carotid artery is within normal limits. Bifurcation is unremarkable. The cervical left ICA is moderately tortuous without focal stenosis or vascular injury. Vertebral arteries: The vertebral arteries are codominant. Both vertebral arteries originate from the subclavian arteries without significant stenosis. There is no focal stenosis or injury to either vertebral artery in the neck. Skeleton: Vertebral body heights alignment are maintained. No focal lytic or blastic lesions are present. Mild endplate degenerative changes are present at C3-4. Other neck: The soft tissues of the neck are unremarkable. Upper chest: Focal early centrilobular emphysematous changes are suggested. Upper mediastinum and thoracic inlet are within normal limits. Review of the MIP images confirms the above findings CTA HEAD FINDINGS Anterior circulation: There is marked attenuation of the right internal carotid artery at the skull base and through the cavernous segment. Contrast is more robust in the right A1 segment. There is a partial filling defect in the distal right M1 segment through the right MCA bifurcation. The right MCA branch vessels are intact. The partial filling defect extends into  the anterior M2 branch. Left MCA branch vessels and bilateral ACA branch vessels are within normal limits. The left internal carotid artery, left A1 and M1 segments are normal. Posterior circulation: Vertebral arteries are codominant. PICA origins are visualized and normal. Basilar artery is normal. Both posterior cerebral arteries originate from the basilar tip. PCA branch vessels are normal. Venous sinuses: The dural sinuses are patent. The right transverse sinus is dominant. Straight sinus deep cerebral veins are intact. Cortical veins are within normal limits. Anatomic variants: None Delayed phase: Postcontrast  images demonstrate no pathologic enhancement or infarct. Review of the MIP images confirms the above findings IMPRESSION: 1. 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 2. The right internal carotid artery is reconstituted at the skull base. This may represent retrograde flow from a more distal reconstitution. 3. Decreased intensity of contrast suggesting decreased perfusion within the right internal carotid artery at the skull base through the ICA terminus. 4. Partial filling defect of the distal right M1 segment extending into the anterior right M2 segment compatible with thrombus. 5. No emergent large vessel occlusion. 6. Left MCA and bilateral ACA distributions are unremarkable. 7. No acute infarct. These results were called by telephone at the time of interpretation on 03/18/2018 at 12:40 pm to Dr. Sharman Cheek , who verbally acknowledged these results. Electronically Signed   By: Marin Roberts M.D.   On: 03/18/2018 12:49   Ct Angio Neck W And/or Wo Contrast  Result Date: 03/18/2018 CLINICAL DATA:  Headache, acute, severe, worst headache of life. Episode of right-sided weakness and right visual loss lasting 3 minutes which has since resolved. EXAM: CT ANGIOGRAPHY HEAD AND NECK TECHNIQUE: Multidetector CT imaging of the head and neck was performed using the standard protocol during bolus administration of intravenous contrast. Multiplanar CT image reconstructions and MIPs were obtained to evaluate the vascular anatomy. Carotid stenosis measurements (when applicable) are obtained utilizing NASCET criteria, using the distal internal carotid diameter as the denominator. CONTRAST:  75mL OMNIPAQUE IOHEXOL 350 MG/ML SOLN COMPARISON:  None. FINDINGS: CT HEAD FINDINGS Brain: No acute cortical infarct, hemorrhage, or mass lesion is present. The ventricles are of normal size. Basal ganglia are intact. Insular ribbon is normal. No significant white matter disease is present.  The brainstem and cerebellum are normal. Vascular: A hyperdense right MCA is present. No significant vascular calcifications are present. Skull: Calvarium is intact. No focal lytic or blastic lesions are present. Sinuses: The paranasal sinuses and mastoid air cells are clear. Orbits: Globes and orbits are within normal limits. Review of the MIP images confirms the above findings CTA NECK FINDINGS Aortic arch: A 3 vessel arch configuration is present. No significant vascular disease or stenosis is present. Right carotid system: The right common carotid artery is within normal limits. Bifurcation is unremarkable. The proximal right ICA is decreased in caliber compared to the left. There is focal irregularity just below the skull base with marked tapering compatible with focal dissection. The right ICA is tortuous. Left carotid system: The left common carotid artery is within normal limits. Bifurcation is unremarkable. The cervical left ICA is moderately tortuous without focal stenosis or vascular injury. Vertebral arteries: The vertebral arteries are codominant. Both vertebral arteries originate from the subclavian arteries without significant stenosis. There is no focal stenosis or injury to either vertebral artery in the neck. Skeleton: Vertebral body heights alignment are maintained. No focal lytic or blastic lesions are present. Mild endplate degenerative changes are present at C3-4. Other neck: The soft  tissues of the neck are unremarkable. Upper chest: Focal early centrilobular emphysematous changes are suggested. Upper mediastinum and thoracic inlet are within normal limits. Review of the MIP images confirms the above findings CTA HEAD FINDINGS Anterior circulation: There is marked attenuation of the right internal carotid artery at the skull base and through the cavernous segment. Contrast is more robust in the right A1 segment. There is a partial filling defect in the distal right M1 segment through the right  MCA bifurcation. The right MCA branch vessels are intact. The partial filling defect extends into the anterior M2 branch. Left MCA branch vessels and bilateral ACA branch vessels are within normal limits. The left internal carotid artery, left A1 and M1 segments are normal. Posterior circulation: Vertebral arteries are codominant. PICA origins are visualized and normal. Basilar artery is normal. Both posterior cerebral arteries originate from the basilar tip. PCA branch vessels are normal. Venous sinuses: The dural sinuses are patent. The right transverse sinus is dominant. Straight sinus deep cerebral veins are intact. Cortical veins are within normal limits. Anatomic variants: None Delayed phase: Postcontrast images demonstrate no pathologic enhancement or infarct. Review of the MIP images confirms the above findings IMPRESSION: 1. 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 2. The right internal carotid artery is reconstituted at the skull base. This may represent retrograde flow from a more distal reconstitution. 3. Decreased intensity of contrast suggesting decreased perfusion within the right internal carotid artery at the skull base through the ICA terminus. 4. Partial filling defect of the distal right M1 segment extending into the anterior right M2 segment compatible with thrombus. 5. No emergent large vessel occlusion. 6. Left MCA and bilateral ACA distributions are unremarkable. 7. No acute infarct. These results were called by telephone at the time of interpretation on 03/18/2018 at 12:40 pm to Dr. Sharman Cheek , who verbally acknowledged these results. Electronically Signed   By: Marin Roberts M.D.   On: 03/18/2018 12:49    Assessment: 42 y.o. male pertinent history of smoking, illicit drug use (cocaine and marijuana) presenting to the ED with sudden loss of right vision, syncope and headache concerning for thrombotic event.  CTA head and neck reviewed which  showed distal cervical right ICA occlusion concerning for dissection, decreased perfusion within the right ICA the skull base through the ICA terminus, filling defect of the distal right M1 segment stenting into the anterior right M2 segment compatible with thrombus, otherwise no emergent large vessel occlusion.  Patient states that he was not on anticoagulation or antiplatelet prior to this event.  Admits to recent cocaine and marijuana use.    Stroke Risk Factors - smoking, illicit drug use, family history.  Plan: 1.  MRI brain without contrast 2.  Due to abnormal CTA head and neck that may require emergent intervention patient will be transferred to higher level of care for further stroke work-up, management and possible intervention.   This patient was staffed with Dr. Verlon Au, Thad Ranger who agreed with assessment and plan of care as above.  Webb Silversmith, DNP, FNP-BC Board certified Nurse Practitioner Neurology Department   03/18/2018, 3:07 PM

## 2018-03-18 NOTE — Anesthesia Postprocedure Evaluation (Signed)
Anesthesia Post Note  Patient: Robert Jacobson  Procedure(s) Performed: IR WITH ANESTHESIA (N/A )     Patient location during evaluation: ICU Anesthesia Type: MAC Level of consciousness: awake and alert Pain management: pain level controlled Vital Signs Assessment: post-procedure vital signs reviewed and stable Respiratory status: spontaneous breathing, nonlabored ventilation and respiratory function stable Cardiovascular status: blood pressure returned to baseline and stable Postop Assessment: no apparent nausea or vomiting Anesthetic complications: no    Last Vitals:  Vitals:   03/18/18 1630 03/18/18 1645  BP: (!) 144/101 (!) 130/94  Pulse: 88 88  Resp:  (!) 31  Temp: 36.8 C   SpO2: 100% 100%    Last Pain:  Vitals:                 [name]

## 2018-03-18 NOTE — ED Notes (Signed)
[facility] here to take pt to [facility]. At time of leaving ED, pt alert & oriented x4. ABCs intact. VSS. NAD.

## 2018-03-18 NOTE — ED Notes (Signed)
Oxygen removed. Pt to and from bathroom with steady gait. ABCs intact. NAD.

## 2018-03-18 NOTE — H&P (Addendum)
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 male with significant history of smoking since age 14 and drug use presenting to the ED with chief complaint of right vision loss,syncope and headache. Who originally presented to [facility] ED and was transferred to [facility] for IR.  Per HPI done at [facility] as documented by Webb Silversmith, DNP, FNP-BC 03/18/18 1210: "Patient states that he was watching TV at around 6 PM 03/17/18 when he suddenly lost half of his vision on the right side. Patient describes episode as painless loss of vision in the right eyewithout associated vertigo/dizziness. No symptoms of eye redness or pain and tearing associated with visual loss (intermittent angle closure glaucoma). Patient states nothing seem to precipitate episode such as postural changes or exercise, loss of vision when eyes are moved into certain positions of gaze (gaze-evoked amaurosis) or loss of vision after exercise or a hot shower (Uhthoff's symptom) to suggest demyelinating disease of the optic nerve.He got up to go to the bathroom so he can check what was wrong with his eye when he suddenly passed out. He does not know how long he was on the floor but think itwas about 2 minutes, when he finally woke up he had a headache on the right side. Denies associated symptoms preceding thesyncopeofaura,nausea and vomiting, feeling cold or clammy, visual auras or blurry vision, palpitations shortness of breath chest pain.He denies loss of bladder control or tongue biting.Patient states that he went to bed and woke up the next morning with a very bad headache on the right side of his head which was worse than the previous day.Denies  associated altered sensorium, speech abnormality, cranial nerve deficit, seizures, focal motor or sensory deficits, diplopia, or vomiting,ipsilateral or contralateral paralysis/weakness, numbness or tingling, involuntary movements, tremor. He denies history of head injury or trauma.Patient admits that he occasionally uses drugs such as marijuana and cocaine. The last time he used cocaine was about 3 to 4 days ago. Smokes about 1 pack of cigarettes (20 cigarettes)per day since age 31. 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 MRI brain without contrast pending head." No prior stroke history."    He confirmed the above history and full ROS was performed again.  ED course:  149/103 on arrival to [facility]  BG:[id number]  Date last known well: Date: 03/17/2018 Time last known well: Time: 18:00 tPA Given: No: outside of window No Symptoms                  Modified Rankin: Rankin Score=0 NIHSS:0    No past medical history on file. - has not seen a physician in years.  No past surgical history on file.       Family History  Problem Relation Age of Onset  . Hypertension Mother   . Hypertension Father   . Stroke Maternal Grandmother   . Cerebral aneurysm Maternal Grandfather          Social History:  has no tobacco, alcohol, and drug history on file. +smoking +Marijuana, +Cocaine   Allergies: No Known Allergies  Medications:                                                                                                                                    Current Facility-Administered Medications  Medication Dose Route Frequency Provider Last Rate Last Dose  .  stroke: mapping our early  stages of recovery book   Does not apply Once Williams, Jessica N, NP      . 0.9 %  sodium chloride infusion   Intravenous Continuous Arline Asp, NP      . acetaminophen (TYLENOL) tablet 650 mg  650 mg Oral Q4H PRN Arline Asp, NP       Or  . acetaminophen (TYLENOL) solution 650 mg  650 mg Per Tube Q4H PRN Arline Asp, NP       Or  . acetaminophen (TYLENOL) suppository 650 mg  650 mg Rectal Q4H PRN Arline Asp, NP      . lidocaine (XYLOCAINE) 1 % (with pres) injection           . senna-docusate (Senokot-S) tablet 1 tablet  1 tablet Oral QHS PRN Arline Asp, NP       No current outpatient medications on file.    ROS:  ROS was performed and is negative except as noted in HPI  GeneralExamination:                                                                                               Today's Vitals   03/18/18 1630 03/18/18 1645 03/18/18 1700  BP: (!) 144/101 (!) 130/94 131/78  Pulse: 88 88 77  Resp:  (!) 31 20  Temp: 98.3 F (36.8 C)    SpO2: 100% 100% 100%  PainSc:   0-No pain   There is no height or weight on file to calculate BMI. HEENT-  Normocephalic, no lesions, without obvious abnormality.  Normal external eye and conjunctiva.  Cardiovascular- S1-S2 audible, pulses palpable throughout   Lungs-no rhonchi or wheezing noted, no excessive working breathing.  Saturations within normal limits on RA Abdomen- All 4 quadrants palpated and nontender Extremities- Warm, dry and intact Musculoskeletal-no joint tenderness, deformity or swelling Skin-warm and dry, no hyperpigmentation, vitiligo, or suspicious lesions  Neurological Examination Mental Status: Alert, oriented, thought content appropriate.  Speech fluent without evidence of aphasia.  Able to follow  commands without difficulty. Cranial  Nerves: II: ; Visual fields grossly normal,  III,IV, VI: ptosis not present, extra-ocular motions intact bilaterally, pupils equal, round, reactive to light and accommodation V,VII: smile symmetric, facial light touch sensation normal bilaterally VIII: hearing normal bilaterally IX,X: uvula rises symmetrically XI: bilateral shoulder shrug XII: midline tongue extension Motor: Right :  Upper extremity   5/5              Left:     Upper extremity   5/5             Lower extremity   5/5              Lower extremity   5/5 Tone and bulk:normal tone throughout; no atrophy noted Sensory: d light touch intact throughout, bilaterally Deep Tendon Reflexes: 2+ and symmetric biceps and patella Cerebellar: normal finger-to-nose,  normal heel-to-shin test Gait: deferred   Lab Results: Basic Metabolic Panel: [facility]  Recent Labs  Lab 03/18/18 1147  NA 139  K 3.9  CL 100  CO2 26  GLUCOSE 91  BUN 9  CREATININE 0.91  CALCIUM 9.3      CBC: [facility]  Recent Labs  Lab 03/18/18 1147  WBC 5.5  NEUTROABS 3.4  HGB 15.8  HCT 45.8  MCV 92.7  PLT 280      Lipid Panel: [facility]  No results for input(s): CHOL, TRIG, HDL, CHOLHDL, VLDL, LDLCALC in the last 168 hours.    Imaging: CTA H+N IMPRESSION: 1. 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 2. The right internal carotid artery is reconstituted at the skull base. This may represent retrograde flow from a more distal reconstitution. 3. Decreased intensity of contrast suggesting decreased perfusion within the right internal carotid artery at the skull base through the ICA terminus. 4. Partial filling defect of the distal right M1 segment extending into the anterior right M2 segment compatible with thrombus. 5. No emergent large vessel occlusion. 6. Left MCA and bilateral ACA distributions are  unremarkable. 7. No acute infarct.   Valentina Lucks, MSN, NP-C [facility] [phone]  03/18/2018, 3:13 PM   Attending physician note to follow with Assessment and plan .   Attending addendum Patient seen and examined Presented to [facility] with focal symptoms suggestive of stroke and imaging showed right ICA dissectiion through the skull base. NIHSS 0. Taken for emergent diagnostic angio to assess for need for stenting. Adequately copensated vasculature from the opposite side. No need for emergent intervention. Should he worsen overnight, might need repeat vascular imaging.  Assessment: 42 y.o. male with significant history of smoking since age 69 and drug use presenting to the ED with chief complaint of right vision loss,syncope and headache. Who originally presented to [facility] ED and was transferred to [facility] for IR. MRI showed RICA occlusion. CT head: no hemorrhage. Patient admits to recent cocaine and marijuana use.    Impression: Stroke Risk Factors - smoking, drug use   --Permissive HTN - BP goal-do not treat unless >180 --MRI Brain  --CTA --Echocardiogram -- ASA load and plavix load followed by DAPT -- High intensity Statin if LDL > 70 -- HgbA1c, fasting lipid panel -- PT consult, OT consult, Speech consult --Telemetry monitoring --Frequent neuro checks --Stroke swallow screen  -smoking cessation  CNS -close neuuromonitoring -Low threshold for repeat CTA if he worsens overnight  CVS -BP goals as above -No active issues  Respiratory -No active issues -Monitor clinically  Hematologic -No active issues -Check CBC in the morning -Transfuse if hemoglobin less than 7  GI/GU:  -NPO until passes swallow screen -doc/senna Gentle hydration  DVT prophylaxis: SCD + SQH  --please page stroke NP Or PA Or MD from 8am -4 pm as this patient from this time will be followed by the stroke. You can look them up on [email or web address] Password [email or web address]  -- Milon Dikes, MD [facility] Pager: [phone] If 7pm to  7am, please call on call as listed on AMION.  CRITICAL CARE ATTESTATION This patient is critically ill and at significant risk of neurological worsening, death and care requires constant monitoring of vital signs, hemodynamics, respiratory, and cardiac monitoring. I spent 60  minutes of neurocritical care time performing neurological assessment, discussion with family, other specialists and medical decision making of high complexity in the care of  this patient.

## 2018-03-18 NOTE — H&P (Deleted)
NEURO HOSPITALIST  H&P   Chief Complaint :right vision loss, Syncope.HA  History obtained from:  Patient    HPI:                                                                                                                                         Robert Jacobson is an 42 y.o. male with significant history of smoking since age 89 and drug use presenting to the ED with chief complaint of right vision loss, syncope and headache. Who originally presented to [facility] ED and was transferred to [facility] for IR.  Per HPI @ [facility]: " Patient states that he was watching TV at around 6 PM 03/17/18 when he suddenly lost half of his vision on the right side. Patient describes episode as painless loss of vision in the right eye without associated vertigo/dizziness. No symptoms of eye redness or pain and tearing associated with visual loss (intermittent angle closure glaucoma). Patient states nothing seem to precipitate episode such as postural changes or exercise, loss of vision when eyes are moved into certain positions of gaze (gaze-evoked amaurosis) or loss of vision after exercise or a hot shower (Uhthoff's symptom) to suggest demyelinating disease of the optic nerve. He got up to go to the bathroom so he can check what was wrong with his eye when he suddenly passed out.  He does not know how long he was on the floor but think it was about 2 minutes, when he finally woke up he had a headache on the right side. Denies associated symptoms preceding the syncopeof aura, nausea and vomiting, feeling cold or clammy, visual auras or blurry vision, palpitations shortness of breath chest pain. He denies loss of bladder control or tongue biting.  Patient states that he went to bed and woke up the next morning with a very bad headache on the right side of his head which was worse than the previous day. Denies associated altered sensorium, speech abnormality, cranial nerve deficit,  seizures, focal motor or sensory deficits, diplopia, or vomiting, ipsilateral or contralateral paralysis/weakness, numbness or tingling, involuntary movements, tremor. He denies history of head injury or trauma.  Patient admits that he occasionally uses drugs such as marijuana and cocaine.  The last time he used cocaine was about 3 to 4 days ago.  Smokes about 1 pack of cigarettes (20 cigarettes) per day since age 43.  Initial CTA head and neck showed distal cervical right ICA occlusion concerning for dissection, decreased perfusion within the right ICA the skull base through the ICA terminus, filling defect of the distal right M1 segment stenting into the anterior right  M2 segment compatible with thrombus, otherwise no emergent large vessel occlusion.  MRI brain without contrast pending head." No prior stroke history.     ED course:  149/103 on arrival to [facility]  BG:91  Date last known well: Date: 03/17/2018 Time last known well: Time: 18:00 tPA Given: No: outside of window No Symptoms   Modified Rankin: Rankin Score=0 NIHSS:0    No past medical history on file.  No past surgical history on file.  Family History  Problem Relation Age of Onset  . Hypertension Mother   . Hypertension Father   . Stroke Maternal Grandmother   . Cerebral aneurysm Maternal Grandfather          Social History:  has no tobacco, alcohol, and drug history on file.  Allergies: No Known Allergies  Medications:                                                                                                                           Current Facility-Administered Medications  Medication Dose Route Frequency Provider Last Rate Last Dose  .  stroke: mapping our early stages of recovery book   Does not apply Once Williams, Jessica N, NP      . 0.9 %  sodium chloride infusion   Intravenous Continuous Arline Asp, NP      . acetaminophen (TYLENOL) tablet 650 mg  650 mg Oral Q4H PRN Arline Asp, NP        Or  . acetaminophen (TYLENOL) solution 650 mg  650 mg Per Tube Q4H PRN Arline Asp, NP       Or  . acetaminophen (TYLENOL) suppository 650 mg  650 mg Rectal Q4H PRN Arline Asp, NP      . lidocaine (XYLOCAINE) 1 % (with pres) injection           . senna-docusate (Senokot-S) tablet 1 tablet  1 tablet Oral QHS PRN Arline Asp, NP       No current outpatient medications on file.     ROS:                                                                                                                                       ROS was performed and is negative except as noted in HPI  General Examination:  There were no vitals taken for this visit.  HEENT-  Normocephalic, no lesions, without obvious abnormality.  Normal external eye and conjunctiva.  Cardiovascular- S1-S2 audible, pulses palpable throughout   Lungs-no rhonchi or wheezing noted, no excessive working breathing.  Saturations within normal limits on RA Abdomen- All 4 quadrants palpated and nontender Extremities- Warm, dry and intact Musculoskeletal-no joint tenderness, deformity or swelling Skin-warm and dry, no hyperpigmentation, vitiligo, or suspicious lesions  Neurological Examination Mental Status: Alert, oriented, thought content appropriate.  Speech fluent without evidence of aphasia.  Able to follow  commands without difficulty. Cranial Nerves: II: ; Visual fields grossly normal,  III,IV, VI: ptosis not present, extra-ocular motions intact bilaterally, pupils equal, round, reactive to light and accommodation V,VII: smile symmetric, facial light touch sensation normal bilaterally VIII: hearing normal bilaterally IX,X: uvula rises symmetrically XI: bilateral shoulder shrug XII: midline tongue extension Motor: Right : Upper extremity   5/5  Left:     Upper extremity   5/5  Lower extremity   5/5  Lower  extremity   5/5 Tone and bulk:normal tone throughout; no atrophy noted Sensory: d light touch intact throughout, bilaterally Deep Tendon Reflexes: 2+ and symmetric biceps and patella Cerebellar: normal finger-to-nose,  normal heel-to-shin test Gait: deferred   Lab Results: Basic Metabolic Panel: Recent Labs  Lab 03/18/18 1147  NA 139  K 3.9  CL 100  CO2 26  GLUCOSE 91  BUN 9  CREATININE 0.91  CALCIUM 9.3    CBC: Recent Labs  Lab 03/18/18 1147  WBC 5.5  NEUTROABS 3.4  HGB 15.8  HCT 45.8  MCV 92.7  PLT 280    Lipid Panel: No results for input(s): CHOL, TRIG, HDL, CHOLHDL, VLDL, LDLCALC in the last 168 hours.  CBG: No results for input(s): GLUCAP in the last 168 hours.  Imaging: Ct Angio Head W Or Wo Contrast  Result Date: 03/18/2018 CLINICAL DATA:  Headache, acute, severe, worst headache of life. Episode of right-sided weakness and right visual loss lasting 3 minutes which has since resolved. EXAM: CT ANGIOGRAPHY HEAD AND NECK TECHNIQUE: Multidetector CT imaging of the head and neck was performed using the standard protocol during bolus administration of intravenous contrast. Multiplanar CT image reconstructions and MIPs were obtained to evaluate the vascular anatomy. Carotid stenosis measurements (when applicable) are obtained utilizing NASCET criteria, using the distal internal carotid diameter as the denominator. CONTRAST:  75mL OMNIPAQUE IOHEXOL 350 MG/ML SOLN COMPARISON:  None. FINDINGS: CT HEAD FINDINGS Brain: No acute cortical infarct, hemorrhage, or mass lesion is present. The ventricles are of normal size. Basal ganglia are intact. Insular ribbon is normal. No significant white matter disease is present. The brainstem and cerebellum are normal. Vascular: A hyperdense right MCA is present. No significant vascular calcifications are present. Skull: Calvarium is intact. No focal lytic or blastic lesions are present. Sinuses: The paranasal sinuses and mastoid air cells  are clear. Orbits: Globes and orbits are within normal limits. Review of the MIP images confirms the above findings CTA NECK FINDINGS Aortic arch: A 3 vessel arch configuration is present. No significant vascular disease or stenosis is present. Right carotid system: The right common carotid artery is within normal limits. Bifurcation is unremarkable. The proximal right ICA is decreased in caliber compared to the left. There is focal irregularity just below the skull base with marked tapering compatible with focal dissection. The right ICA is tortuous. Left carotid system: The left common carotid artery is within normal limits. Bifurcation is unremarkable. The  cervical left ICA is moderately tortuous without focal stenosis or vascular injury. Vertebral arteries: The vertebral arteries are codominant. Both vertebral arteries originate from the subclavian arteries without significant stenosis. There is no focal stenosis or injury to either vertebral artery in the neck. Skeleton: Vertebral body heights alignment are maintained. No focal lytic or blastic lesions are present. Mild endplate degenerative changes are present at C3-4. Other neck: The soft tissues of the neck are unremarkable. Upper chest: Focal early centrilobular emphysematous changes are suggested. Upper mediastinum and thoracic inlet are within normal limits. Review of the MIP images confirms the above findings CTA HEAD FINDINGS Anterior circulation: There is marked attenuation of the right internal carotid artery at the skull base and through the cavernous segment. Contrast is more robust in the right A1 segment. There is a partial filling defect in the distal right M1 segment through the right MCA bifurcation. The right MCA branch vessels are intact. The partial filling defect extends into the anterior M2 branch. Left MCA branch vessels and bilateral ACA branch vessels are within normal limits. The left internal carotid artery, left A1 and M1 segments  are normal. Posterior circulation: Vertebral arteries are codominant. PICA origins are visualized and normal. Basilar artery is normal. Both posterior cerebral arteries originate from the basilar tip. PCA branch vessels are normal. Venous sinuses: The dural sinuses are patent. The right transverse sinus is dominant. Straight sinus deep cerebral veins are intact. Cortical veins are within normal limits. Anatomic variants: None Delayed phase: Postcontrast images demonstrate no pathologic enhancement or infarct. Review of the MIP images confirms the above findings IMPRESSION: 1. 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 2. The right internal carotid artery is reconstituted at the skull base. This may represent retrograde flow from a more distal reconstitution. 3. Decreased intensity of contrast suggesting decreased perfusion within the right internal carotid artery at the skull base through the ICA terminus. 4. Partial filling defect of the distal right M1 segment extending into the anterior right M2 segment compatible with thrombus. 5. No emergent large vessel occlusion. 6. Left MCA and bilateral ACA distributions are unremarkable. 7. No acute infarct. These results were called by telephone at the time of interpretation on 03/18/2018 at 12:40 pm to Dr. Sharman Cheek , who verbally acknowledged these results. Electronically Signed   By: Marin Roberts M.D.   On: 03/18/2018 12:49   Ct Angio Neck W And/or Wo Contrast  Result Date: 03/18/2018 CLINICAL DATA:  Headache, acute, severe, worst headache of life. Episode of right-sided weakness and right visual loss lasting 3 minutes which has since resolved. EXAM: CT ANGIOGRAPHY HEAD AND NECK TECHNIQUE: Multidetector CT imaging of the head and neck was performed using the standard protocol during bolus administration of intravenous contrast. Multiplanar CT image reconstructions and MIPs were obtained to evaluate the vascular  anatomy. Carotid stenosis measurements (when applicable) are obtained utilizing NASCET criteria, using the distal internal carotid diameter as the denominator. CONTRAST:  75mL OMNIPAQUE IOHEXOL 350 MG/ML SOLN COMPARISON:  None. FINDINGS: CT HEAD FINDINGS Brain: No acute cortical infarct, hemorrhage, or mass lesion is present. The ventricles are of normal size. Basal ganglia are intact. Insular ribbon is normal. No significant white matter disease is present. The brainstem and cerebellum are normal. Vascular: A hyperdense right MCA is present. No significant vascular calcifications are present. Skull: Calvarium is intact. No focal lytic or blastic lesions are present. Sinuses: The paranasal sinuses and mastoid air cells are clear. Orbits: Globes and orbits are  within normal limits. Review of the MIP images confirms the above findings CTA NECK FINDINGS Aortic arch: A 3 vessel arch configuration is present. No significant vascular disease or stenosis is present. Right carotid system: The right common carotid artery is within normal limits. Bifurcation is unremarkable. The proximal right ICA is decreased in caliber compared to the left. There is focal irregularity just below the skull base with marked tapering compatible with focal dissection. The right ICA is tortuous. Left carotid system: The left common carotid artery is within normal limits. Bifurcation is unremarkable. The cervical left ICA is moderately tortuous without focal stenosis or vascular injury. Vertebral arteries: The vertebral arteries are codominant. Both vertebral arteries originate from the subclavian arteries without significant stenosis. There is no focal stenosis or injury to either vertebral artery in the neck. Skeleton: Vertebral body heights alignment are maintained. No focal lytic or blastic lesions are present. Mild endplate degenerative changes are present at C3-4. Other neck: The soft tissues of the neck are unremarkable. Upper chest: Focal  early centrilobular emphysematous changes are suggested. Upper mediastinum and thoracic inlet are within normal limits. Review of the MIP images confirms the above findings CTA HEAD FINDINGS Anterior circulation: There is marked attenuation of the right internal carotid artery at the skull base and through the cavernous segment. Contrast is more robust in the right A1 segment. There is a partial filling defect in the distal right M1 segment through the right MCA bifurcation. The right MCA branch vessels are intact. The partial filling defect extends into the anterior M2 branch. Left MCA branch vessels and bilateral ACA branch vessels are within normal limits. The left internal carotid artery, left A1 and M1 segments are normal. Posterior circulation: Vertebral arteries are codominant. PICA origins are visualized and normal. Basilar artery is normal. Both posterior cerebral arteries originate from the basilar tip. PCA branch vessels are normal. Venous sinuses: The dural sinuses are patent. The right transverse sinus is dominant. Straight sinus deep cerebral veins are intact. Cortical veins are within normal limits. Anatomic variants: None Delayed phase: Postcontrast images demonstrate no pathologic enhancement or infarct. Review of the MIP images confirms the above findings IMPRESSION: 1. 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 2. The right internal carotid artery is reconstituted at the skull base. This may represent retrograde flow from a more distal reconstitution. 3. Decreased intensity of contrast suggesting decreased perfusion within the right internal carotid artery at the skull base through the ICA terminus. 4. Partial filling defect of the distal right M1 segment extending into the anterior right M2 segment compatible with thrombus. 5. No emergent large vessel occlusion. 6. Left MCA and bilateral ACA distributions are unremarkable. 7. No acute infarct. These results were  called by telephone at the time of interpretation on 03/18/2018 at 12:40 pm to Dr. Sharman Cheek , who verbally acknowledged these results. Electronically Signed   By: Marin Roberts M.D.   On: 03/18/2018 12:49     Valentina Lucks, MSN, NP-C [facility] [phone]    03/18/2018, 3:13 PM   Attending physician note to follow with Assessment and plan .   Assessment: 42 y.o. male with significant history of smoking since age 62 and drug use presenting to the ED with chief complaint of right vision loss, syncope and headache. Who originally presented to [facility] ED and was transferred to [facility] for IR. MRI showed RICA occlusion. CT head: no hemorrhage. Patient admits to recent cocaine and marijuana use.    Impression:  Stroke Risk Factors - smoking     Stroke: -- BP goal :SBP 120-140 --MRI Brain  --CTA --Echocardiogram -- ASA -- High intensity Statin if LDL > 70 -- HgbA1c, fasting lipid panel -- PT consult, OT consult, Speech consult --Telemetry monitoring --Frequent neuro checks --Stroke swallow screen  -smoking cessation  DVT prophylaxis: SCD  GI/GU:  -NPO until passes swallow screen -doc/senna Gentle hydration    --please page stroke NP  Or  PA  Or MD from 8am -4 pm  as this patient from this time will be  followed by the stroke.   You can look them up on [email or web address]  Password [email or web address]

## 2018-03-18 NOTE — ED Triage Notes (Signed)
Pt reports last night around 6pm he began having a throbbing headache and half of his vision went out on his right side, reports he attempted to stand up and fell down, states that he could not move his rt side of his body for about 3 mins and it resolved. Pt reports this am that he went to UC and was noted to have elevated BP, sent here. Pt reports continued headache.

## 2018-03-19 ENCOUNTER — Inpatient Hospital Stay (HOSPITAL_COMMUNITY): Payer: 59

## 2018-03-19 DIAGNOSIS — F17200 Nicotine dependence, unspecified, uncomplicated: Secondary | ICD-10-CM

## 2018-03-19 DIAGNOSIS — I6389 Other cerebral infarction: Secondary | ICD-10-CM

## 2018-03-19 DIAGNOSIS — F1210 Cannabis abuse, uncomplicated: Secondary | ICD-10-CM

## 2018-03-19 DIAGNOSIS — G453 Amaurosis fugax: Secondary | ICD-10-CM

## 2018-03-19 DIAGNOSIS — F1410 Cocaine abuse, uncomplicated: Secondary | ICD-10-CM

## 2018-03-19 LAB — LIPID PANEL
Cholesterol: 192 mg/dL (ref 0–200)
HDL: 64 mg/dL (ref >40)
LDL Cholesterol: 120 mg/dL — ABNORMAL HIGH (ref 0–99)
Total CHOL/HDL Ratio: 3 RATIO
Triglycerides: 41 mg/dL (ref <150)
VLDL: 8 mg/dL (ref 0–40)

## 2018-03-19 LAB — ECHOCARDIOGRAM COMPLETE
HEIGHTINCHES: 71 in
WEIGHTICAEL: 2560 [oz_av]

## 2018-03-19 LAB — HEMOGLOBIN A1C
Hgb A1c MFr Bld: 5.1 % (ref 4.8–5.6)
MEAN PLASMA GLUCOSE: 99.67 mg/dL

## 2018-03-19 MED ORDER — ATORVASTATIN CALCIUM 40 MG PO TABS: 40.0000 mg | ORAL_TABLET | Freq: Every day | ORAL | Status: DC

## 2018-03-19 MED ORDER — ASPIRIN 325 MG PO TBEC: 325.0000 mg | DELAYED_RELEASE_TABLET | Freq: Every day | ORAL | 0 refills | Status: DC

## 2018-03-19 MED ORDER — ATORVASTATIN CALCIUM 40 MG PO TABS: 40.0000 mg | ORAL_TABLET | Freq: Every day | ORAL | 3 refills | Status: DC

## 2018-03-19 MED ORDER — CLOPIDOGREL BISULFATE 75 MG PO TABS: 75.0000 mg | ORAL_TABLET | Freq: Every day | ORAL | 2 refills | Status: DC

## 2018-03-19 NOTE — Progress Notes (Signed)
  Echocardiogram 2D Echocardiogram has been performed.  Delcie Roch 03/19/2018, 10:58 AM

## 2018-03-19 NOTE — Progress Notes (Signed)
Referring Physician(s): Arora,Ashish  Supervising Physician: Julieanne Cotton  Patient Status:  [facility] - In-pt  Chief Complaint: R ICA CVA  Subjective: Patient up walking in room.  Eating and drinking well.  States he has no residual deficits from stroke.  Remembers much of yesterday's events.   Allergies: Patient has no known allergies.  Medications: Prior to Admission medications   Medication Sig Start Date End Date Taking? Authorizing Provider  ibuprofen (ADVIL,MOTRIN) 200 MG tablet Take 600 mg by mouth every 6 (six) hours as needed for moderate pain.   Yes [provider]     Vital Signs: BP 140/82   Pulse 97   Temp 98 F (36.7 C) (Oral)   Resp 15   SpO2 100%   Physical Exam  NAD, alert, moving around room freely Neuro: No focal deficits noted.  Groin: intact, soft.  Dressing soiled from yesterday but no progression of bleeding.  No evidence of hematoma or pseudoaneurysm.   Imaging: Ct Angio Head W Or Wo Contrast  Result Date: 03/18/2018 CLINICAL DATA:  Headache, acute, severe, worst headache of life. Episode of right-sided weakness and right visual loss lasting 3 minutes which has since resolved. EXAM: CT ANGIOGRAPHY HEAD AND NECK TECHNIQUE: Multidetector CT imaging of the head and neck was performed using the standard protocol during bolus administration of intravenous contrast. Multiplanar CT image reconstructions and MIPs were obtained to evaluate the vascular anatomy. Carotid stenosis measurements (when applicable) are obtained utilizing NASCET criteria, using the distal internal carotid diameter as the denominator. CONTRAST:  75mL OMNIPAQUE IOHEXOL 350 MG/ML SOLN COMPARISON:  None. FINDINGS: CT HEAD FINDINGS Brain: No acute cortical infarct, hemorrhage, or mass lesion is present. The ventricles are of normal size. Basal ganglia are intact. Insular ribbon is normal. No significant white matter disease is present. The brainstem and cerebellum are normal.  Vascular: A hyperdense right MCA is present. No significant vascular calcifications are present. Skull: Calvarium is intact. No focal lytic or blastic lesions are present. Sinuses: The paranasal sinuses and mastoid air cells are clear. Orbits: Globes and orbits are within normal limits. Review of the MIP images confirms the above findings CTA NECK FINDINGS Aortic arch: A 3 vessel arch configuration is present. No significant vascular disease or stenosis is present. Right carotid system: The right common carotid artery is within normal limits. Bifurcation is unremarkable. The proximal right ICA is decreased in caliber compared to the left. There is focal irregularity just below the skull base with marked tapering compatible with focal dissection. The right ICA is tortuous. Left carotid system: The left common carotid artery is within normal limits. Bifurcation is unremarkable. The cervical left ICA is moderately tortuous without focal stenosis or vascular injury. Vertebral arteries: The vertebral arteries are codominant. Both vertebral arteries originate from the subclavian arteries without significant stenosis. There is no focal stenosis or injury to either vertebral artery in the neck. Skeleton: Vertebral body heights alignment are maintained. No focal lytic or blastic lesions are present. Mild endplate degenerative changes are present at C3-4. Other neck: The soft tissues of the neck are unremarkable. Upper chest: Focal early centrilobular emphysematous changes are suggested. Upper mediastinum and thoracic inlet are within normal limits. Review of the MIP images confirms the above findings CTA HEAD FINDINGS Anterior circulation: There is marked attenuation of the right internal carotid artery at the skull base and through the cavernous segment. Contrast is more robust in the right A1 segment. There is a partial filling defect in the distal  right M1 segment through the right MCA bifurcation. The right MCA branch  vessels are intact. The partial filling defect extends into the anterior M2 branch. Left MCA branch vessels and bilateral ACA branch vessels are within normal limits. The left internal carotid artery, left A1 and M1 segments are normal. Posterior circulation: Vertebral arteries are codominant. PICA origins are visualized and normal. Basilar artery is normal. Both posterior cerebral arteries originate from the basilar tip. PCA branch vessels are normal. Venous sinuses: The dural sinuses are patent. The right transverse sinus is dominant. Straight sinus deep cerebral veins are intact. Cortical veins are within normal limits. Anatomic variants: None Delayed phase: Postcontrast images demonstrate no pathologic enhancement or infarct. Review of the MIP images confirms the above findings IMPRESSION: 1. 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 2. The right internal carotid artery is reconstituted at the skull base. This may represent retrograde flow from a more distal reconstitution. 3. Decreased intensity of contrast suggesting decreased perfusion within the right internal carotid artery at the skull base through the ICA terminus. 4. Partial filling defect of the distal right M1 segment extending into the anterior right M2 segment compatible with thrombus. 5. No emergent large vessel occlusion. 6. Left MCA and bilateral ACA distributions are unremarkable. 7. No acute infarct. These results were called by telephone at the time of interpretation on 03/18/2018 at 12:40 pm to Dr. Sharman Cheek , who verbally acknowledged these results. Electronically Signed   By: Marin Roberts M.D.   On: 03/18/2018 12:49   Ct Angio Neck W And/or Wo Contrast  Result Date: 03/18/2018 CLINICAL DATA:  Headache, acute, severe, worst headache of life. Episode of right-sided weakness and right visual loss lasting 3 minutes which has since resolved. EXAM: CT ANGIOGRAPHY HEAD AND NECK TECHNIQUE:  Multidetector CT imaging of the head and neck was performed using the standard protocol during bolus administration of intravenous contrast. Multiplanar CT image reconstructions and MIPs were obtained to evaluate the vascular anatomy. Carotid stenosis measurements (when applicable) are obtained utilizing NASCET criteria, using the distal internal carotid diameter as the denominator. CONTRAST:  75mL OMNIPAQUE IOHEXOL 350 MG/ML SOLN COMPARISON:  None. FINDINGS: CT HEAD FINDINGS Brain: No acute cortical infarct, hemorrhage, or mass lesion is present. The ventricles are of normal size. Basal ganglia are intact. Insular ribbon is normal. No significant white matter disease is present. The brainstem and cerebellum are normal. Vascular: A hyperdense right MCA is present. No significant vascular calcifications are present. Skull: Calvarium is intact. No focal lytic or blastic lesions are present. Sinuses: The paranasal sinuses and mastoid air cells are clear. Orbits: Globes and orbits are within normal limits. Review of the MIP images confirms the above findings CTA NECK FINDINGS Aortic arch: A 3 vessel arch configuration is present. No significant vascular disease or stenosis is present. Right carotid system: The right common carotid artery is within normal limits. Bifurcation is unremarkable. The proximal right ICA is decreased in caliber compared to the left. There is focal irregularity just below the skull base with marked tapering compatible with focal dissection. The right ICA is tortuous. Left carotid system: The left common carotid artery is within normal limits. Bifurcation is unremarkable. The cervical left ICA is moderately tortuous without focal stenosis or vascular injury. Vertebral arteries: The vertebral arteries are codominant. Both vertebral arteries originate from the subclavian arteries without significant stenosis. There is no focal stenosis or injury to either vertebral artery in the neck. Skeleton:  Vertebral body heights alignment  are maintained. No focal lytic or blastic lesions are present. Mild endplate degenerative changes are present at C3-4. Other neck: The soft tissues of the neck are unremarkable. Upper chest: Focal early centrilobular emphysematous changes are suggested. Upper mediastinum and thoracic inlet are within normal limits. Review of the MIP images confirms the above findings CTA HEAD FINDINGS Anterior circulation: There is marked attenuation of the right internal carotid artery at the skull base and through the cavernous segment. Contrast is more robust in the right A1 segment. There is a partial filling defect in the distal right M1 segment through the right MCA bifurcation. The right MCA branch vessels are intact. The partial filling defect extends into the anterior M2 branch. Left MCA branch vessels and bilateral ACA branch vessels are within normal limits. The left internal carotid artery, left A1 and M1 segments are normal. Posterior circulation: Vertebral arteries are codominant. PICA origins are visualized and normal. Basilar artery is normal. Both posterior cerebral arteries originate from the basilar tip. PCA branch vessels are normal. Venous sinuses: The dural sinuses are patent. The right transverse sinus is dominant. Straight sinus deep cerebral veins are intact. Cortical veins are within normal limits. Anatomic variants: None Delayed phase: Postcontrast images demonstrate no pathologic enhancement or infarct. Review of the MIP images confirms the above findings IMPRESSION: 1. 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 2. The right internal carotid artery is reconstituted at the skull base. This may represent retrograde flow from a more distal reconstitution. 3. Decreased intensity of contrast suggesting decreased perfusion within the right internal carotid artery at the skull base through the ICA terminus. 4. Partial filling defect of the distal  right M1 segment extending into the anterior right M2 segment compatible with thrombus. 5. No emergent large vessel occlusion. 6. Left MCA and bilateral ACA distributions are unremarkable. 7. No acute infarct. These results were called by telephone at the time of interpretation on 03/18/2018 at 12:40 pm to Dr. Sharman Cheek , who verbally acknowledged these results. Electronically Signed   By: Marin Roberts M.D.   On: 03/18/2018 12:49    Labs:  CBC: Recent Labs    03/18/18 1147  WBC 5.5  HGB 15.8  HCT 45.8  PLT 280    COAGS: No results for input(s): INR, APTT in the last 8760 hours.  BMP: Recent Labs    03/18/18 1147  NA 139  K 3.9  CL 100  CO2 26  GLUCOSE 91  BUN 9  CALCIUM 9.3  CREATININE 0.91  GFRNONAA >60  GFRAA >60    LIVER FUNCTION TESTS: No results for input(s): BILITOT, AST, ALT, ALKPHOS, PROT, ALBUMIN in the last 8760 hours.  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 2.RT MCA and RT ACA collateralized from the Lt ICA via ACOM and via RT  PCOM from the verts bilaterally.   Patient initiated dual antiplatelet therapy:  Plavix 75 mg daily, ASA 325 mg daily.  Plan is for CTA Head and Neck as an output within the next few weeks with follow-up with Dr. Corliss Skains in approximately 4 weeks.   Patient likely nearly discharge if imaging and condition stable today.   Electronically Signed: Hoyt Koch, PA 03/19/2018, 12:36 PM   I spent a total of 15 Minutes at the the patient's bedside AND on the patient's hospital floor or unit, greater  than 50% of which was counseling/coordinating care for R ICA CVA.

## 2018-03-19 NOTE — Discharge Instructions (Signed)
1. Take Aspirin 325 mg daily with Plavix 75 mg daily for 3 months. Then stop Plavix and take aspirin 325 mg every day. 2. A repeat CT of your head and neck will be arranged at your next office visit with neurology. 3. Please quit smoking. 4. Find a primary physician to see on a regular basis. Within two weeks if possible. 5. Take only medications prescribed for you.

## 2018-03-19 NOTE — Progress Notes (Signed)
Reviewed discharge instructions with patient, patient sent home with family member.

## 2018-03-19 NOTE — Evaluation (Signed)
Physical Therapy Evaluation/ Discharge Patient Details Name: Robert Jacobson MRN: [id number] DOB: 1975/09/18 Today's Date: 03/19/2018   History of Present Illness  42 y.o. male with significant history of smoking since age 81 and drug use admitted with right vision loss, syncope and headache. Pt with Rt ICA occlusion s/p revascularization  Clinical Impression  Pt with bil LE and UE strength 5/5, no sensation or balance deficits. Pt able to read signs and complete brief visual field screen with monocular and binocular vision without difficulty. Pt educated for stroke signs and symptoms BE FAST. Pt currently at baseline functional mobility without further therapy needs. Pt aware and agreeable, will sign off.     Follow Up Recommendations No PT follow up    Equipment Recommendations  None recommended by PT    Recommendations for Other Services       Precautions / Restrictions Precautions Precautions: None      Mobility  Bed Mobility Overal bed mobility: Independent                Transfers Overall transfer level: Independent                  Ambulation/Gait Ambulation/Gait assistance: Independent Gait Distance (Feet): 350 Feet Assistive device: None Gait Pattern/deviations: WFL(Within Functional Limits)   Gait velocity interpretation: >4.37 ft/sec, indicative of normal walking speed General Gait Details: Pt with steady gait able to complete change of direction, speed and head turns without difficulty  Stairs Stairs: Yes Stairs assistance: Independent Stair Management: No rails;Forwards Number of Stairs: 2    Wheelchair Mobility    Modified Rankin (Stroke Patients Only) Modified Rankin (Stroke Patients Only) Pre-Morbid Rankin Score: No symptoms Modified Rankin: No symptoms     Balance Overall balance assessment: Independent                               Standardized Balance Assessment Standardized Balance Assessment : Dynamic  Gait Index   Dynamic Gait Index Level Surface: Normal Change in Gait Speed: Normal Gait with Horizontal Head Turns: Normal Gait with Vertical Head Turns: Normal Gait and Pivot Turn: Normal Step Over Obstacle: Normal Step Around Obstacles: Normal Steps: Normal Total Score: 24       Pertinent Vitals/Pain Pain Assessment: No/denies pain    Home Living Family/patient expects to be discharged to:: Private residence Living Arrangements: Alone Available Help at Discharge: Family;Friend(s);Available PRN/intermittently Type of Home: House Home Access: Stairs to enter Entrance Stairs-Rails: Doctor, general practice of Steps: 2 Home Layout: One level Home Equipment: None      Prior Function Level of Independence: Independent               Hand Dominance        Extremity/Trunk Assessment   Upper Extremity Assessment Upper Extremity Assessment: Overall WFL for tasks assessed    Lower Extremity Assessment Lower Extremity Assessment: Overall WFL for tasks assessed    Cervical / Trunk Assessment Cervical / Trunk Assessment: Normal  Communication   Communication: No difficulties  Cognition Arousal/Alertness: Awake/alert Behavior During Therapy: WFL for tasks assessed/performed Overall Cognitive Status: Within Functional Limits for tasks assessed                                        General Comments      Exercises     Assessment/Plan  PT Assessment Patent does not need any further PT services  PT Problem List         PT Treatment Interventions      PT Goals (Current goals can be found in the Care Plan section)  Acute Rehab PT Goals PT Goal Formulation: All assessment and education complete, DC therapy    Frequency     Barriers to discharge        Co-evaluation               AM-PAC PT "6 Clicks" Daily Activity  Outcome Measure Difficulty turning over in bed (including adjusting bedclothes, sheets and  blankets)?: None Difficulty moving from lying on back to sitting on the side of the bed? : None Difficulty sitting down on and standing up from a chair with arms (e.g., wheelchair, bedside commode, etc,.)?: None Help needed moving to and from a bed to chair (including a wheelchair)?: None Help needed walking in hospital room?: None Help needed climbing 3-5 steps with a railing? : None 6 Click Score: 24    End of Session Equipment Utilized During Treatment: Gait belt Activity Tolerance: Patient tolerated treatment well Patient left: in chair;with call bell/phone within reach;with family/visitor present Nurse Communication: Mobility status PT Visit Diagnosis: Other abnormalities of gait and mobility (R26.89)    Time: 1610-9604 PT Time Calculation (min) (ACUTE ONLY): 18 min   Charges:   PT Evaluation $PT Eval Low Complexity: 1 Low          Musette Kisamore Abner Greenspan, PT Acute Rehabilitation Services Pager: [phone] Office: [phone]   Gelene Recktenwald B Amairany Schumpert 03/19/2018, 9:07 AM

## 2018-03-19 NOTE — Discharge Summary (Addendum)
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  Active Problems: Right ICA dissection Hyperlipidemia Smoker Alcohol user Cocaine abuse THC abuse Right amaurosis fugax  History reviewed. No pertinent past medical history. History reviewed. No pertinent surgical history.  Allergies as of 03/19/2018   No Known Allergies     Medication List    STOP taking these medications   ibuprofen 200 MG tablet Commonly known as:  ADVIL,MOTRIN     TAKE these medications   aspirin 325 MG EC tablet Take 1 tablet (325 mg total) by mouth daily. Start taking on:  03/20/2018   atorvastatin 40 MG tablet Commonly known as:  LIPITOR Take 1 tablet (40 mg total) by mouth daily at 6 PM.   clopidogrel 75 MG tablet Commonly known as:  PLAVIX Take 1 tablet (75 mg total) by mouth daily. Start taking on:  03/20/2018       LABORATORY STUDIES CBC    Component Value Date/Time   WBC 5.5 03/18/2018 1147   RBC 4.94 03/18/2018 1147   HGB 15.8 03/18/2018 1147   HGB 16.6 06/16/2013 0826   HCT 45.8 03/18/2018 1147   HCT 48.0 06/16/2013 0826   PLT 280 03/18/2018 1147   PLT 332 06/16/2013 0826   MCV 92.7 03/18/2018 1147   MCV 92 06/16/2013 0826   MCH 32.0 03/18/2018 1147   MCHC 34.5 03/18/2018 1147   RDW 13.4 03/18/2018 1147   RDW 13.7 06/16/2013 0826   LYMPHSABS 1.4 03/18/2018 1147   MONOABS 0.5 03/18/2018 1147   EOSABS 0.2 03/18/2018 1147   BASOSABS 0.0 03/18/2018 1147   CMP    Component Value Date/Time   NA 139 03/18/2018 1147   NA 131 (L) 06/16/2013 0826   K 3.9 03/18/2018 1147   K 4.7 06/16/2013 0826   CL 100 03/18/2018 1147   CL  101 06/16/2013 0826   CO2 26 03/18/2018 1147   CO2 22 06/16/2013 0826   GLUCOSE 91 03/18/2018 1147   GLUCOSE 104 (H) 06/16/2013 0826   BUN 9 03/18/2018 1147   BUN 12 06/16/2013 0826   CREATININE 0.91 03/18/2018 1147   CREATININE 1.07 06/16/2013 0826   CALCIUM 9.3 03/18/2018 1147   CALCIUM 9.5 06/16/2013 0826   PROT 9.1 (H) 06/16/2013 0826   ALBUMIN 4.8 06/16/2013 0826   AST 23 06/16/2013 0826   ALT 26 06/16/2013 0826   ALKPHOS 69 06/16/2013 0826   BILITOT 0.6 06/16/2013 0826   GFRNONAA >60 03/18/2018 1147   GFRNONAA >60 06/16/2013 0826   GFRAA >60 03/18/2018 1147   GFRAA >60 06/16/2013 0826   COAGS Lab Results  Component Value Date   INR 1.0 06/16/2013   Lipid Panel    Component Value Date/Time   CHOL 192 03/19/2018 0335   TRIG 41 03/19/2018 0335   HDL 64 03/19/2018 0335   CHOLHDL 3.0 03/19/2018 0335   VLDL 8 03/19/2018 0335   LDLCALC 120 (H) 03/19/2018 0335   HgbA1C  Lab Results  Component Value Date   HGBA1C 5.1 03/19/2018   Urinalysis    Component Value Date/Time   COLORURINE Yellow 06/16/2013 0936   APPEARANCEUR  Clear 06/16/2013 0936   LABSPEC 1.028 06/16/2013 0936   PHURINE 5.0 06/16/2013 0936   GLUCOSEU Negative 06/16/2013 0936   HGBUR Negative 06/16/2013 0936   BILIRUBINUR Negative 06/16/2013 0936   KETONESUR Trace 06/16/2013 0936   PROTEINUR Negative 06/16/2013 0936   NITRITE Negative 06/16/2013 0936   LEUKOCYTESUR Negative 06/16/2013 0936   Urine Drug Screen     Component Value Date/Time   LABOPIA NONE DETECTED 03/18/2018 2005   COCAINSCRNUR POSITIVE (A) 03/18/2018 2005   LABBENZ POSITIVE (A) 03/18/2018 2005   AMPHETMU NONE DETECTED 03/18/2018 2005   THCU POSITIVE (A) 03/18/2018 2005   LABBARB NONE DETECTED 03/18/2018 2005    Alcohol Level No results found for: ETH   SIGNIFICANT DIAGNOSTIC STUDIES  Ct Angio Head W Or Wo Contrast Ct Angio Neck W And/or Wo Contrast 03/18/2018 IMPRESSION:  1. Distal cervical right internal carotid  artery occlusion, likely dissection.  2. The right internal carotid artery is reconstituted at the skull base. This may represent retrograde flow from a more distal reconstitution.  3. Decreased intensity of contrast suggesting decreased perfusion within the right internal carotid artery at the skull base through the ICA terminus.  4. Partial filling defect of the distal right M1 segment extending into the anterior right M2 segment compatible with thrombus.  5. No emergent large vessel occlusion.  6. Left MCA and bilateral ACA distributions are unremarkable.  7. No acute infarct.   MR Brain WO Contrast 03/19/2018 IMPRESSION: Two small foci of nonhemorrhagic acute infarction within the RIGHT MCA territory, as described above. Thrombosed RIGHT ICA due to dissection. Intracranial anteriorcirculation is patent from collateral flow. Premature for age atrophy. Remote brain substance loss RIGHT frontal lobe, likely closed head injury.  S/P 4 vessel cerebral arteriogram RT CFA approach. Findings . 1. Occluded RT ICA In the petrous seg with a procx tappered severe preocclussive stenosis C/W dissection. 2.RT MCA and RT ACA collateralized from the Lt ICA via ACOM and via RT PCOM from the verts bilaterally.   Transthoracic Echocardiogram  03/19/2018 Study Conclusions - Left ventricle: The cavity size was normal. Wall thickness was   normal. Systolic function was normal. The estimated ejection   fraction was in the range of 60% to 65%. LV apical false tendon.   Wall motion was normal; there were no regional wall motion   abnormalities. Left ventricular diastolic function parameters   were normal. - Left atrium: The atrium was normal in size. - Inferior vena cava: The vessel was normal in size. The   respirophasic diameter changes were in the normal range (>= 50%),   consistent with normal central venous pressure. Impressions: - Normal study.     HISTORY OF PRESENT  ILLNESS [name] an 42 y.o.malewithsignificant history of smoking since age 42 and drug use presenting to the ED with chief complaint of right vision loss,syncope and headache.Who originally presented to [facility] ED and was transferred to [facility] for IR.  Per HPI @ [facility]: "Patient states that he was watching TV at around 6 PM10/10/19when he suddenly lost half of his vision on the right side. Patient describes episode as painless loss of vision in the right eyewithout associated vertigo/dizziness. No symptoms of eye redness or pain and tearing associated with visual loss (intermittent angle closure glaucoma). Patient states nothing seem to precipitate episode such as postural changes or exercise, loss of vision when eyes are moved into certain positions of gaze (gaze-evoked amaurosis) or loss of vision after exercise or a hot shower (Uhthoff's  symptom) to suggest demyelinating disease of the optic nerve.He got up to go to the bathroom so he can check what was wrong with his eye when he suddenly passed out. He does not know how long he was on the floor but think itwas about 2 minutes, when he finally woke up he had a headache on the right side. Denies associated symptoms preceding thesyncopeofaura,nausea and vomiting, feeling cold or clammy, visual auras or blurry vision, palpitations shortness of breath chest pain.He denies loss of bladder control or tongue biting.Patient states that he went to bed and woke up the next morning with a very bad headache on the right side of his head which was worse than the previous day.Denies associated altered sensorium, speech abnormality, cranial nerve deficit, seizures, focal motor or sensory deficits, diplopia, or vomiting,ipsilateral or contralateral paralysis/weakness, numbness or tingling, involuntary movements, tremor. He denies history of head injury or trauma.Patient admits that he occasionally uses drugs such as marijuana and cocaine. The  last time he used cocaine was about 3 to 4 days ago. Smokes about 1 pack of cigarettes (20 cigarettes)per day since age 66. Initial CTA head and neck showed distal cervical right ICA occlusion concerning for dissection, decreased perfusion within the right ICA the skull base through the ICA terminus, filling defect of the distal right M1 segment stenting into the anterior right M2 segment compatible with thrombus, otherwise no emergent large vessel occlusion. MRI brain without contrast pending head." No prior stroke history."  He confirmed the above history and full ROS was performed again.  ED course: 149/103 on arrival to [facility] BG:[id number]  Date last known well:Date:03/17/2018 Time last known well:Time:18:00 tPA Given:No:outside of windowNo Symptoms Modified Rankin:Rankin Score=0 NIHSS:0  HOSPITAL COURSE Robert Jacobson is a 42 y.o. male with history of tobacco use and substance abuse, presenting with sudden onset of blindness OD, syncope, and headache. He did not receive IV t-PA due to late presentation.  Stroke with right amaurosis fugax:   Right MCA territory 2 punctate infarcts, embolic pattern, likely related to right distal cervical ICA dissection  Resultant - back to baseline  CT head - No acute infarct.  MRI head - two right MCA territory punctate infarcts  CTA H&N - Distal cervical right internal carotid artery occlusion, likely dissection. Partial filling defect of the distal right M1 segment extending into the anterior right M2 segment compatible with thrombus.   Cerebral angiogram - right ICA occlusion in the petrous segment, right MCA patent from collaterals including A-comm and P-comm  2D Echo - EF 60 to 60% unremarkable  LDL - 120  HgbA1c - 5.1  UDS - positive for cocaine, benzo and THC  VTE prophylaxis - [place] Heparin  Diet - regular diet  No antithrombotic prior to admission, now on aspirin 325 mg daily and clopidogrel 75  mg daily.  Continue DAPT for 3 months and then aspirin alone.  Patient counseled to be compliant with his antithrombotic medications  Ongoing aggressive stroke risk factor management  Therapy recommendations: No F/U therapy  Disposition: Discharge  Right ICA dissection  Likely to explain his right MCA territory stroke and amaurosis fugax on the right  CTA concerning for right ICA distal cervical segment dissection  DSA confirmed right ICA petrous segment occlusion versus dissection  On DAPT for 3 months  Repeat CTA head and neck in 2-3 months.  Cocaine and THC abuse  UDS positive for cocaine and THC  Cocaine cessation education provided  Patient is willing to  quit  Hyperlipidemia  Lipid lowering medication PTA:  none  LDL 120, goal < 70  Current lipid lowering medication: Lipitor 40 mg daily  Continue statin at discharge  Tobacco abuse  Current smoker  Smoking cessation counseling provided  Nicotine patch provided  Pt is willing to quit  Other Stroke Risk Factors  Family hx stroke (grandmother)  Hx of substance abuse   DISCHARGE EXAM Blood pressure (!) 135/91, pulse 86, temperature 98.2 F (36.8 C), temperature source Oral, resp. rate (!) 8, SpO2 98 %. Mental Status: Alert, oriented, thought content appropriate.  Speech fluent without evidence of aphasia.  Able to follow 3 step commands without difficulty. Cranial Nerves: II: Discs not visualized; Visual fields grossly normal, pupils equal, round, reactive to light. III,IV, VI: ptosis not present, extra-ocular motions intact bilaterally V,VII: smile symmetric, facial light touch sensation normal bilaterally VIII: hearing normal bilaterally IX,X: gag reflex present XI: bilateral shoulder shrug intact. XII: midline tongue extension Motor: RUE - 5/5    LUE - 5/5 RLE - 5/5    LLE -  5/5 Tone and bulk:normal tone throughout; no atrophy noted Sensory: Light touch intact throughout,  bilaterally Deep Tendon Reflexes: 2+ and symmetric throughout Plantars: Right: downgoing   Left: downgoing Cerebellar: normal finger-to-nose, normal rapid alternating movements and normal heel-to-shin test Gait: normal   Discharge Diet    Diet Order            Diet regular Room service appropriate? Yes; Fluid consistency: Thin  Diet effective now            liquids   DISCHARGE INSTRUCTIONS TO PATIENT 1. Take Aspirin 325 mg daily with Plavix 75 mg daily for 3 months. Then stop Plavix and take aspirin 325 mg every day. 2. A repeat CT of your head and neck in 2 to 3 months, which will be arranged at your next office visit with neurology in 4 weeks. 3. Please quit smoking, cocaine, and other illicit drugs. 4. Find a primary physician to see on a regular basis. Within two weeks if possible. 5. Take only medications prescribed for you.   DISCHARGE PLAN  Disposition:  Discharge to home  Aspirin 325 mg daily and clopidogrel 75 mg daily for secondary stroke prevention for 3 months then ASA alone.  Ongoing risk factor control by Primary Care Physician at time of discharge  Follow-up Patient, No Pcp Per in 2 weeks.  Follow-up in [facility] in 4 weeks, office to schedule an appointment.   Repeat CTA Head and Neck in 2-3 months.  40 minutes were spent preparing discharge.  Marvel Plan, MD PhD Stroke Neurology 03/19/2018 4:08 PM

## 2018-03-21 ENCOUNTER — Encounter (HOSPITAL_COMMUNITY): Payer: Self-pay | Admitting: Radiology

## 2018-03-21 ENCOUNTER — Other Ambulatory Visit (HOSPITAL_COMMUNITY): Payer: Self-pay | Admitting: Interventional Radiology

## 2018-03-21 DIAGNOSIS — I771 Stricture of artery: Secondary | ICD-10-CM

## 2018-03-22 ENCOUNTER — Encounter (HOSPITAL_COMMUNITY): Payer: Self-pay | Admitting: Interventional Radiology

## 2018-03-23 ENCOUNTER — Ambulatory Visit (INDEPENDENT_AMBULATORY_CARE_PROVIDER_SITE_OTHER): Payer: 59 | Admitting: Nurse Practitioner

## 2018-03-23 ENCOUNTER — Encounter: Payer: Self-pay | Admitting: Nurse Practitioner

## 2018-03-23 ENCOUNTER — Other Ambulatory Visit: Payer: Self-pay

## 2018-03-23 DIAGNOSIS — I6340 Cerebral infarction due to embolism of unspecified cerebral artery: Secondary | ICD-10-CM | POA: Diagnosis not present

## 2018-03-23 DIAGNOSIS — F17210 Nicotine dependence, cigarettes, uncomplicated: Secondary | ICD-10-CM | POA: Insufficient documentation

## 2018-03-23 DIAGNOSIS — E785 Hyperlipidemia, unspecified: Secondary | ICD-10-CM | POA: Insufficient documentation

## 2018-03-23 DIAGNOSIS — F1020 Alcohol dependence, uncomplicated: Secondary | ICD-10-CM

## 2018-03-23 DIAGNOSIS — I10 Essential (primary) hypertension: Secondary | ICD-10-CM | POA: Diagnosis not present

## 2018-03-23 DIAGNOSIS — E782 Mixed hyperlipidemia: Secondary | ICD-10-CM | POA: Diagnosis not present

## 2018-03-23 DIAGNOSIS — Z720 Tobacco use: Secondary | ICD-10-CM

## 2018-03-23 DIAGNOSIS — F1410 Cocaine abuse, uncomplicated: Secondary | ICD-10-CM

## 2018-03-23 MED ORDER — LOSARTAN POTASSIUM 50 MG PO TABS: 25.0000 mg | ORAL_TABLET | Freq: Every day | ORAL | 3 refills | Status: DC

## 2018-03-23 NOTE — Assessment & Plan Note (Addendum)
New onset with recent events.  Will initiate Losartan 25MG  PO QDAY and patient is aware he is to continue monitoring BP at home.  Reiterated to patient necessity to go to ER immediately for HTN with headache and any change in vision or function.  Labs during next visit.

## 2018-03-23 NOTE — Assessment & Plan Note (Signed)
Initial presentation 03/18/18.  CT noted "right ICA dissection" and "right MCA territory punctate infarcts".  Follow-up with neurology is scheduled for 04/11/18 and is to have repeat CT scan in 2-3 months.  Continue ASA and Clopidogrel for prevention and switch to ASA only in 3 months per recommendations.  Consider GI protection with PPI during upcoming visits.  Continue to encourage cocaine cessation.

## 2018-03-23 NOTE — Assessment & Plan Note (Signed)
Last use 1 1/2 weeks ago.  Recent stroke and has not used since hospitalization.  Positive screening in hospital.  Continue to encourage cessation.  Drug screen at next visit.

## 2018-03-23 NOTE — Assessment & Plan Note (Signed)
Chronic, ongoing with h/o rehab admission.  Currently is working on decreasing alcohol intake d/t his HTN.  Continue to encourage reduction and cessation.  Monitor closely and discuss rehab admission if worsening.

## 2018-03-23 NOTE — Assessment & Plan Note (Signed)
Chronic, every day smoker.  Encourage cessation and discuss use of patches or medications during visits to assist with cessation.

## 2018-03-23 NOTE — Patient Instructions (Signed)
Losartan tablets  What is this medicine?  LOSARTAN (loe SAR tan) is used to treat high blood pressure and to reduce the risk of stroke in certain patients. This drug also slows the progression of kidney disease in patients with diabetes.  This medicine may be used for other purposes; ask your health care provider or pharmacist if you have questions.  COMMON BRAND NAME(S): Cozaar  What should I tell my health care provider before I take this medicine?  They need to know if you have any of these conditions:  -heart failure  -kidney or liver disease  -an unusual or allergic reaction to losartan, other medicines, foods, dyes, or preservatives  -pregnant or trying to get pregnant  -breast-feeding  How should I use this medicine?  Take this medicine by mouth with a glass of water. Follow the directions on the prescription label. This medicine can be taken with or without food. Take your doses at regular intervals. Do not take your medicine more often than directed.  Talk to your pediatrician regarding the use of this medicine in children. Special care may be needed.  Overdosage: If you think you have taken too much of this medicine contact a poison control center or emergency room at once.  NOTE: This medicine is only for you. Do not share this medicine with others.  What if I miss a dose?  If you miss a dose, take it as soon as you can. If it is almost time for your next dose, take only that dose. Do not take double or extra doses.  What may interact with this medicine?  -blood pressure medicines  -diuretics, especially triamterene, spironolactone, or amiloride  -fluconazole  -NSAIDs, medicines for pain and inflammation, like ibuprofen or naproxen  -potassium salts or potassium supplements  -rifampin  This list may not describe all possible interactions. Give your health care provider a list of all the medicines, herbs, non-prescription drugs, or dietary supplements you use. Also tell them if you smoke, drink alcohol, or  use illegal drugs. Some items may interact with your medicine.  What should I watch for while using this medicine?  Visit your doctor or health care professional for regular checks on your progress. Check your blood pressure as directed. Ask your doctor or health care professional what your blood pressure should be and when you should contact him or her. Call your doctor or health care professional if you notice an irregular or fast heart beat.  Women should inform their doctor if they wish to become pregnant or think they might be pregnant. There is a potential for serious side effects to an unborn child, particularly in the second or third trimester. Talk to your health care professional or pharmacist for more information.  You may get drowsy or dizzy. Do not drive, use machinery, or do anything that needs mental alertness until you know how this drug affects you. Do not stand or sit up quickly, especially if you are an older patient. This reduces the risk of dizzy or fainting spells. Alcohol can make you more drowsy and dizzy. Avoid alcoholic drinks.  Avoid salt substitutes unless you are told otherwise by your doctor or health care professional.  Do not treat yourself for coughs, colds, or pain while you are taking this medicine without asking your doctor or health care professional for advice. Some ingredients may increase your blood pressure.  What side effects may I notice from receiving this medicine?  Side effects that you should report to   irregular heart beat, palpitations, or chest pain -skin rash, itching -swelling of your face, lips, tongue, hands, or feet Side effects that usually do not require medical attention (report to your doctor or health care  professional if they continue or are bothersome): -cough -decreased sexual function or desire -headache -nasal congestion or stuffiness -nausea or stomach pain -sore or cramping muscles This list may not describe all possible side effects. Call your doctor for medical advice about side effects. You may report side effects to FDA at 1-800-FDA-1088. Where should I keep my medicine? Keep out of the reach of children. Store at room temperature between 15 and 30 degrees C (59 and 86 degrees F). Protect from light. Keep container tightly closed. Throw away any unused medicine after the expiration date. NOTE: This sheet is a summary. It may not cover all possible information. If you have questions about this medicine, talk to your doctor, pharmacist, or health care provider.  2018 Elsevier/Gold Standard (2007-08-05 16:42:18) Hypertension Hypertension is another name for high blood pressure. High blood pressure forces your heart to work harder to pump blood. This can cause problems over time. There are two numbers in a blood pressure reading. There is a top number (systolic) over a bottom number (diastolic). It is best to have a blood pressure below 120/80. Healthy choices can help lower your blood pressure. You may need medicine to help lower your blood pressure if:  Your blood pressure cannot be lowered with healthy choices.  Your blood pressure is higher than 130/80.  Follow these instructions at home: Eating and drinking  If directed, follow the DASH eating plan. This diet includes: ? Filling half of your plate at each meal with fruits and vegetables. ? Filling one quarter of your plate at each meal with whole grains. Whole grains include whole wheat pasta, brown rice, and whole grain bread. ? Eating or drinking low-fat dairy products, such as skim milk or low-fat yogurt. ? Filling one quarter of your plate at each meal with low-fat (lean) proteins. Low-fat proteins include fish, skinless  chicken, eggs, beans, and tofu. ? Avoiding fatty meat, cured and processed meat, or chicken with skin. ? Avoiding premade or processed food.  Eat less than 1,500 mg of salt (sodium) a day.  Limit alcohol use to no more than 1 drink a day for nonpregnant women and 2 drinks a day for men. One drink equals 12 oz of beer, 5 oz of wine, or 1 oz of hard liquor. Lifestyle  Work with your doctor to stay at a healthy weight or to lose weight. Ask your doctor what the best weight is for you.  Get at least 30 minutes of exercise that causes your heart to beat faster (aerobic exercise) most days of the week. This may include walking, swimming, or biking.  Get at least 30 minutes of exercise that strengthens your muscles (resistance exercise) at least 3 days a week. This may include lifting weights or pilates.  Do not use any products that contain nicotine or tobacco. This includes cigarettes and e-cigarettes. If you need help quitting, ask your doctor.  Check your blood pressure at home as told by your doctor.  Keep all follow-up visits as told by your doctor. This is important. Medicines  Take over-the-counter and prescription medicines only as told by your doctor. Follow directions carefully.  Do not skip doses of blood pressure medicine. The medicine does not work as well if you skip doses. Skipping doses also puts you at  risk for problems.  Ask your doctor about side effects or reactions to medicines that you should watch for. Contact a doctor if:  You think you are having a reaction to the medicine you are taking.  You have headaches that keep coming back (recurring).  You feel dizzy.  You have swelling in your ankles.  You have trouble with your vision. Get help right away if:  You get a very bad headache.  You start to feel confused.  You feel weak or numb.  You feel faint.  You get very bad pain in your: ? Chest. ? Belly (abdomen).  You throw up (vomit) more than  once.  You have trouble breathing. Summary  Hypertension is another name for high blood pressure.  Making healthy choices can help lower blood pressure. If your blood pressure cannot be controlled with healthy choices, you may need to take medicine. This information is not intended to replace advice given to you by your health care provider. Make sure you discuss any questions you have with your health care provider. Document Released: 11/11/2007 Document Revised: 04/22/2016 Document Reviewed: 04/22/2016 Elsevier Interactive Patient Education  [employer].

## 2018-03-23 NOTE — Progress Notes (Signed)
BP 129/83   Pulse 93   Temp 98.5 F (36.9 C) (Oral)   Ht 5\' 9"  (1.753 m)   Wt 150 lb (68 kg)   SpO2 98%   BMI 22.15 kg/m    Subjective:    Patient ID: Robert Jacobson, male    DOB: 1975/10/12, 42 y.o.   MRN: [id number]  HPI: Robert Jacobson is a 42 y.o. male for new patient visit.  Chief Complaint  Patient presents with  . New Patient (Initial Visit)    pt states went to the hospital last Friday with dizziness and vision issues/ pt has high BP concerns  . Headache   HTN: Patient has been taking BP at home and in morning yesterday 134/89 and then yesterday evening 181/135.  He reports that in hospital they told him not to take Ibuprofen, which was reiterated during this visit the importance of not taking.  States that since discharge from hospital he has been monitoring BP daily and in morning it has been SBP 120-130 range, but on occasion in evening it has been elevated (note above) and he has had headache and on Monday night had some vision change with headache, that improved "over time".  Discussed with patient importance of going to ER immediately if elevation in BP with vision change, headache, or numbness in extremities.  Educated him on treatment used for strokes and that there is a time span during which it can be used, hence importance of presenting immediately to ER if symptoms present.  He denies headache today and reports feeling well.  Denies loss of function, numbness, palpitations, CP, or SOB.  Reports his mother and father both have HTN they take medication for.  He was able to verbalize back to provider POC if presents with symptoms noted above.  Continues on Clopidogrel and ASA, which were started in hospital, is to remain on both for 3 months and then transition to only ASA.  Has follow-up with [facility] Neurology 04/11/18 and is to have repeat CT in 2-3 months.  HLD: Started on Atorvastatin in hospital, as was noted on imaging to have "right ICA dissection"  and "right MCA territory punctate infarcts".  He is to continue this medication and reports he has been taking it daily in evening.    SUBSTANCE ABUSE (COCAINE AND ALCOHOL) Discussed his h/o cocaine use and he reports he has not used since discharge from hospital because "that stuff scared me and they told me if I keep doing it, it could kill me".  States that his last use was 1 1/2 weeks ago.  Was positive for cocaine and MJ during recent hospitalization.  He does report he smoked MJ yesterday, encouraged him to cut back on use.  Mr. Klumpp also reports heavy alcohol use, drinking a 6 pack each night.  However, he states he has cut back on this during recent hospitalization as he realizes "it can make my blood pressure worse".  Now currently drinking 3 beers a night and is working to quit use.  Has h/o admission to rehab facility in past, per his report, for alcohol use.  Encouraged him to continue cessation of cocaine and to continue to work towards decreasing alcohol and MJ use.  Relevant past medical, surgical, family and social history reviewed and updated as indicated. Interim medical history since our last visit reviewed. Allergies and medications reviewed and updated.  Review of Systems  Constitutional: Negative for activity change, diaphoresis, fatigue, fever and unexpected weight change.  HENT: Negative.   Eyes: Negative.   Respiratory: Negative for cough, chest tightness, shortness of breath and wheezing.   Cardiovascular: Negative for chest pain, palpitations and leg swelling.  Gastrointestinal: Negative for abdominal distention, abdominal pain, nausea and vomiting.  Endocrine: Negative.   Genitourinary: Negative.   Musculoskeletal: Negative for back pain, neck pain and neck stiffness.  Skin: Negative.   Neurological: Positive for headaches. Negative for dizziness, tremors, syncope, facial asymmetry, speech difficulty, weakness and numbness.  Hematological: Negative.     Psychiatric/Behavioral: Negative for behavioral problems.   Per HPI unless specifically indicated above     Objective:    BP 129/83   Pulse 93   Temp 98.5 F (36.9 C) (Oral)   Ht 5\' 9"  (1.753 m)   Wt 150 lb (68 kg)   SpO2 98%   BMI 22.15 kg/m   Wt Readings from Last 3 Encounters:  03/23/18 150 lb (68 kg)  03/18/18 160 lb (72.6 kg)    Physical Exam  Constitutional: He is oriented to person, place, and time. He appears well-developed and well-nourished.  HENT:  Head: Normocephalic and atraumatic.  Right Ear: External ear normal.  Left Ear: External ear normal.  Nose: Nose normal.  Mouth/Throat: Oropharynx is clear and moist.  Poor dentition  Eyes: Pupils are equal, round, and reactive to light. EOM are normal.  Neck: Normal range of motion. Neck supple.  Cardiovascular: Normal rate, regular rhythm and normal heart sounds.  Pulmonary/Chest: Effort normal and breath sounds normal.  Abdominal: Soft. Bowel sounds are normal.  Musculoskeletal: Normal range of motion.  Neurological: He is alert and oriented to person, place, and time. He has normal strength and normal reflexes. No cranial nerve deficit.  Skin: Skin is warm and dry.  Psychiatric: He has a normal mood and affect. His speech is normal and behavior is normal. Judgment and thought content normal. Cognition and memory are normal.    Results for orders placed or performed during the hospital encounter of 03/18/18  MRSA PCR Screening  Result Value Ref Range   MRSA by PCR NEGATIVE NEGATIVE  Hemoglobin A1c  Result Value Ref Range   Hgb A1c MFr Bld 5.1 4.8 - 5.6 %   Mean Plasma Glucose 99.67 mg/dL  Lipid panel  Result Value Ref Range   Cholesterol 192 0 - 200 mg/dL   Triglycerides 41 <161 mg/dL   HDL 64 >09 mg/dL   Total CHOL/HDL Ratio 3.0 RATIO   VLDL 8 0 - 40 mg/dL   LDL Cholesterol 604 (H) 0 - 99 mg/dL  Rapid urine drug screen (hospital performed)  Result Value Ref Range   Opiates NONE DETECTED NONE  DETECTED   Cocaine POSITIVE (A) NONE DETECTED   Benzodiazepines POSITIVE (A) NONE DETECTED   Amphetamines NONE DETECTED NONE DETECTED   Tetrahydrocannabinol POSITIVE (A) NONE DETECTED   Barbiturates NONE DETECTED NONE DETECTED  ECHOCARDIOGRAM COMPLETE  Result Value Ref Range   Weight 2,560 oz   Height 71 in   BP 140/82 mmHg      Assessment & Plan:   Problem List Items Addressed This Visit      Cardiovascular and Mediastinum   Stroke [facility])    Initial presentation 03/18/18.  CT noted "right ICA dissection" and "right MCA territory punctate infarcts".  Follow-up with neurology is scheduled for 04/11/18 and is to have repeat CT scan in 2-3 months.  Continue ASA and Clopidogrel for prevention and switch to ASA only in 3 months per recommendations.  Consider GI  protection with PPI during upcoming visits.  Continue to encourage cocaine cessation.      Relevant Medications   losartan (COZAAR) 50 MG tablet   Essential (primary) hypertension    New onset with recent events.  Will initiate Losartan 25MG  PO QDAY and patient is aware he is to continue monitoring BP at home.  Reiterated to patient necessity to go to ER immediately for HTN with headache and any change in vision or function.  Labs during next visit.        Relevant Medications   losartan (COZAAR) 50 MG tablet     Other   Hyperlipidemia   Relevant Medications   losartan (COZAAR) 50 MG tablet   Alcohol dependence (HCC)    Chronic, ongoing with h/o rehab admission.  Currently is working on decreasing alcohol intake d/t his HTN.  Continue to encourage reduction and cessation.  Monitor closely and discuss rehab admission if worsening.      Cocaine abuse (HCC)    Last use 1 1/2 weeks ago.  Recent stroke and has not used since hospitalization.  Positive screening in hospital.  Continue to encourage cessation.  Drug screen at next visit.      Tobacco abuse disorder    Chronic, every day smoker.  Encourage cessation and discuss use  of patches or medications during visits to assist with cessation.          Follow up plan: Return in about 2 weeks (around 04/06/2018) for annual physical.

## 2018-03-28 ENCOUNTER — Encounter: Payer: Self-pay | Admitting: Emergency Medicine

## 2018-03-28 ENCOUNTER — Emergency Department: Payer: Commercial Managed Care - HMO

## 2018-03-28 ENCOUNTER — Other Ambulatory Visit: Payer: Self-pay

## 2018-03-28 ENCOUNTER — Ambulatory Visit: Payer: Self-pay | Admitting: *Deleted

## 2018-03-28 ENCOUNTER — Emergency Department
Admission: EM | Admit: 2018-03-28 | Discharge: 2018-03-28 | Disposition: A | Discharge status: Home / Self Care | Payer: Commercial Managed Care - HMO | Attending: Student in an Organized Health Care Education/Training Program | Admitting: Student in an Organized Health Care Education/Training Program

## 2018-03-28 DIAGNOSIS — F17210 Nicotine dependence, cigarettes, uncomplicated: Secondary | ICD-10-CM | POA: Diagnosis not present

## 2018-03-28 DIAGNOSIS — I609 Nontraumatic subarachnoid hemorrhage, unspecified: Secondary | ICD-10-CM | POA: Insufficient documentation

## 2018-03-28 DIAGNOSIS — I10 Essential (primary) hypertension: Secondary | ICD-10-CM | POA: Insufficient documentation

## 2018-03-28 DIAGNOSIS — R51 Headache: Secondary | ICD-10-CM | POA: Diagnosis present

## 2018-03-28 HISTORY — DX: Cerebral infarction, unspecified: I63.9

## 2018-03-28 HISTORY — DX: Essential (primary) hypertension: I10

## 2018-03-28 LAB — BASIC METABOLIC PANEL
ANION GAP: 12 (ref 5–15)
BUN: 14 mg/dL (ref 6–20)
CO2: 27 mmol/L (ref 22–32)
Calcium: 9.1 mg/dL (ref 8.9–10.3)
Chloride: 101 mmol/L (ref 98–111)
Creatinine, Ser: 1.19 mg/dL (ref 0.61–1.24)
GFR calc Af Amer: >60 mL/min (ref >60)
Glucose, Bld: 105 mg/dL — ABNORMAL HIGH (ref 70–99)
POTASSIUM: 3.8 mmol/L (ref 3.5–5.1)
Sodium: 140 mmol/L (ref 135–145)

## 2018-03-28 LAB — CBC
HCT: 44 % (ref 39.0–52.0)
Hemoglobin: 14.9 g/dL (ref 13.0–17.0)
MCH: 31.7 pg (ref 26.0–34.0)
MCHC: 33.9 g/dL (ref 30.0–36.0)
MCV: 93.6 fL (ref 80.0–100.0)
PLATELETS: 319 10*3/uL (ref 150–400)
RBC: 4.7 MIL/uL (ref 4.22–5.81)
RDW: 13.2 % (ref 11.5–15.5)
WBC: 6.2 10*3/uL (ref 4.0–10.5)
nRBC: 0 % (ref 0.0–0.2)

## 2018-03-28 LAB — PROTIME-INR
INR: 0.87
PROTHROMBIN TIME: 11.8 s (ref 11.4–15.2)

## 2018-03-28 LAB — APTT: aPTT: 27 seconds (ref 24–36)

## 2018-03-28 MED ORDER — IOPAMIDOL (ISOVUE-370) INJECTION 76%
75.0000 mL | Freq: Once | INTRAVENOUS | Status: AC | PRN
  Administered 2018-03-28: 75 mL via INTRAVENOUS

## 2018-03-28 NOTE — Discharge Instructions (Signed)
Your CT showed a small head bleed. Stop taking Plavix.  Continue to take aspirin.  Follow-up with your primary care doctor and your neurologist within the next 2 to 3 days.  Return to the emergency room if you have severe headache, slurred speech, facial droop, unilateral weakness or numbness, or if you pass out.

## 2018-03-28 NOTE — Telephone Encounter (Signed)
Thank you.  Noted this.  I agree with this plan of care for patient.

## 2018-03-28 NOTE — ED Notes (Signed)
D/w Dr. Scotty Court, new orders received for CT head, BMP and CBC.  Pt will most likely need MRI per Dr. Scotty Court.

## 2018-03-28 NOTE — ED Provider Notes (Signed)
Patient received in sign-out from Dr. Don Perking.  Workup and evaluation pending repeat CT imaging.  CTA does not show any evidence of vasculitis.  Stable dissection.  Evaluation of previous known subarachnoid somewhat limited by contrast however I discussed the case with Dr. Marcell Barlow who evaluated the CT imaging it does appear consistent with the previous CT imaging that was reported.  Based on this it does appear the patient stable and appropriate for discharge, close outpatient follow-up.Willy Eddy, MD 03/28/18 2044

## 2018-03-28 NOTE — ED Notes (Signed)
Cup of water provided per patient request; ok provided per Neurology.

## 2018-03-28 NOTE — Telephone Encounter (Signed)
Patient with recent stroke due to a blocked artery on the right side of neck. He calls experiencing numbness at his right temple and going back to his right ear, since Saturday morning. Denies all other symptoms. B/P last night was 131/85. He is at work today. Advised ER visit to evaluate this symptom at this time. He will go to [facility]. Routed to PCP.  Reason for Disposition . [1] Numbness (i.e., loss of sensation) of the face, arm / hand, or leg / foot on one side of the body AND [2] sudden onset AND [3] present now  Answer Assessment - Initial Assessment Questions 1. SYMPTOM: "What is the main symptom you are concerned about?" (e.g., weakness, numbness)     Numbness at right temple going to the back of the ear. 2. ONSET: "When did this start?" (minutes, hours, days; while sleeping)     Saturday morning. 3. LAST NORMAL: "When was the last time you were normal (no symptoms)?"     Friday night. 4. PATTERN "Does this come and go, or has it been constant since it started?"  "Is it present now?"     constant 5. CARDIAC SYMPTOMS: "Have you had any of the following symptoms: chest pain, difficulty breathing, palpitations?"     no 6. NEUROLOGIC SYMPTOMS: "Have you had any of the following symptoms: headache, dizziness, vision loss, double vision, changes in speech, unsteady on your feet?"     no 7. OTHER SYMPTOMS: "Do you have any other symptoms?"     no 8. PREGNANCY: "Is there any chance you are pregnant?" "When was your last menstrual period?"     na  Protocols used: NEUROLOGIC DEFICIT-A-AH

## 2018-03-28 NOTE — ED Notes (Signed)
Blue and red top tube in lab

## 2018-03-28 NOTE — ED Notes (Signed)
Patient transported to CT 

## 2018-03-28 NOTE — ED Triage Notes (Signed)
Pt arrived via POV with reports of right side numbness to the side of the head around temple pt states the numbness began on Saturday and has stayed the same since. Pt also states he had a real bad headache on Saturday night. Pt was at [facility] 03/18/18 for CVA and a blockage int he right side of his neck. Pt states he has been taking prescribed meds.  Pt denies any deficits from stroke and states the only issue he is having is the numbness that has been persistent since Saturday.

## 2018-03-28 NOTE — Consult Note (Signed)
Referring Physician:  No referring provider defined for this encounter.  Primary Physician:  Marjie Skiff, NP  Chief Complaint: Subarachnoid hemorrhage  History of Present Illness: Robert Jacobson is a 42 y.o. male a history of hypertension, cocaine and marijuana use, who presents to consult for subarachnoid hemorrhage. On 10/11 he presented to the emergency department for experiencing a brief episode of right sided vision loss, right-sided weakness, and headache. [facility] acute stroke in the setting of right ICA dissection.  He was discharged on 10/12 recommending daily aspirin 325 mg, daily clopidogrel 75 mg for 3 months, neurology follow-up in 4 weeks, and repeat CTA head and neck in 2 to 3 months.  He noticed episode of right temporal numbness and headache.  Headache resolved the next day after taking Tylenol, but numbness has remained persistent since initial presentation.  Denies vision changes, falls/trauma, changes in speech or behavior, upper or lower extremity symptoms including pain/numbness/tingling/weakness, seizures. Upon evaluation today in the emergency department head CT revealed tiny subarachnoid hemorrhage over right cerebral convexity that is new since previous imaging.  Review of Systems:  A 10 point review of systems is negative, except for the pertinent positives and negatives detailed in the HPI.  Past Medical History: Past Medical History:  Diagnosis Date  . Stroke [facility])     Past Surgical History: Past Surgical History:  Procedure Laterality Date  . IR ANGIO INTRA EXTRACRAN SEL COM CAROTID INNOMINATE BILAT MOD SED  03/18/2018  . IR ANGIO VERTEBRAL SEL VERTEBRAL BILAT MOD SED  03/18/2018  . RADIOLOGY WITH ANESTHESIA N/A 03/18/2018   Procedure: IR WITH ANESTHESIA;  Surgeon: Radiologist, Medication, MD;  Location: [facility] OR;  Service: Radiology;  Laterality: N/A;    Allergies: Allergies as of 03/28/2018  . (No  Known Allergies)    Medications: No current facility-administered medications for this encounter.   Current Outpatient Medications:  .  aspirin EC 325 MG EC tablet, Take 1 tablet (325 mg total) by mouth daily., Disp: 30 tablet, Rfl: 0 .  atorvastatin (LIPITOR) 40 MG tablet, Take 1 tablet (40 mg total) by mouth daily at 6 PM., Disp: 30 tablet, Rfl: 3 .  clopidogrel (PLAVIX) 75 MG tablet, Take 1 tablet (75 mg total) by mouth daily., Disp: 30 tablet, Rfl: 2 .  losartan (COZAAR) 50 MG tablet, Take 0.5 tablets (25 mg total) by mouth daily., Disp: 90 tablet, Rfl: 3   Social History: Social History   Tobacco Use  . Smoking status: Current Every Day Smoker    Packs/day: 1.00    Years: 25.00    Pack years: 25.00    Types: Cigarettes  . Smokeless tobacco: Never Used  Substance Use Topics  . Alcohol use: Yes    Comment: 6 pack a day, but is trying to cut back  . Drug use: Yes    Types: Cocaine, Marijuana    Comment: 1.5 weeks ago used cocaine and MJ use yesterday    Family Medical History: Family History  Problem Relation Age of Onset  . Hypertension Mother   . Hypertension Father   . Glaucoma Father   . Stroke Maternal Grandmother   . Cerebral aneurysm Maternal Grandfather     Physical Examination: Vitals:   03/28/18 1500 03/28/18 1530  BP: (!) 126/94 118/86  Pulse: 71 79  Resp: 13 (!) 31  SpO2: 98% 98%     General: Patient is well developed, well nourished, calm, collected, and in no  apparent distress.  Psychiatric: Patient is non-anxious.  Head:  Pupils equal, round, and reactive to light.  ENT:  Oral mucosa appears well hydrated.  Neck:   Supple.  Full range of motion.  Respiratory: Patient is breathing without any difficulty.  Extremities: No edema.  Vascular: Palpable pulses in dorsal pedal vessels.  Skin:   On exposed skin, there are no abnormal skin lesions.  NEUROLOGICAL:  General: In no acute distress.   Awake, alert, oriented to person, place,  and time.  Pupils equal round and reactive to light.  Facial tone is symmetric.  Tongue protrusion is midline.  There is no pronator drift. EOMI  Strength: Side Biceps Triceps Deltoid Interossei Grip Wrist Ext. Wrist Flex.  R 5 5 5 5 5 5 5   L 5 5 5 5 5 5 5    Side Iliopsoas Quads Hamstring PF DF EHL  R 5 5 5 5 5 5   L 5 5 5 5 5 5    Bilateral upper and lower extremity sensation is intact to light touch and pin prick.  Clonus is not present.  Toes are down-going.  Gait not assessed.   Imaging: EXAM: CT HEAD WITHOUT CONTRAST  TECHNIQUE: Contiguous axial images were obtained from the base of the skull through the vertex without intravenous contrast.  COMPARISON:  03/18/2018 CT head. 03/19/2018 MRI head.  FINDINGS: Brain: No evidence of new acute infarction, hydrocephalus, extra-axial collection or mass lesion/mass effect. Very small hypodensities are present corresponding to infarcts on prior MRI of the brain. Tiny volume of subarachnoid hemorrhage is present over the right cerebral convexity (series 2, image 24, series 4, image 27, series 5, image 16).  Vascular: No hyperdense vessel or unexpected calcification.  Skull: Normal. Negative for fracture or focal lesion.  Sinuses/Orbits: No acute finding.  Other: None.  IMPRESSION: New tiny volume of subarachnoid hemorrhage over right cerebral convexity from 03/18/2018. No additional acute abnormality identified.   Assessment and Plan: Mr. Devonshire is a pleasant 42 y.o. male with new subarachnoid hemorrhage.  Dr. Myer Haff was consulted and recommended repeat CT with CTA in 6 hours to monitor if hemorrhage is progressing or has stabilized.  Recommend transfer if hemorrhage is not stabilized.   In the meantime, recommend neurology consult and discontinuation of Plavix.    Ivar Drape, PA-C Dept. of Neurosurgery

## 2018-03-28 NOTE — Consult Note (Signed)
Received phone call from ER.  42 yo male who had a R MCA stroke a week ago and was stared on ASA/plavix for dissection.  Presents today with numbness and headache, and has small focus SAH in a non-aneurysmal pattern.  - observe for 6 hours, then repeat HCT - Would recommend discontinuing plavix.  Ok to continue ASA if HCT is stable - If repeat HCT worsens, would recommend transfer to Arby Barrette MD

## 2018-03-28 NOTE — ED Provider Notes (Signed)
[facility] Emergency Department Provider Note  ____________________________________________  Time seen: Approximately 2:22 PM  I have reviewed the triage vital signs and the nursing notes.   HISTORY  Chief Complaint Numbness   HPI PATTERSON HOLLENBAUGH is a 42 y.o. 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  Patient reports 2 days ago he had a severe headache located on the right side of his head.  He denies thunderclap headache.  He reports that the headache was associated with numbness in the right temporal region which has persisted. Today he called his primary care doctor who told him to come to the emergency room for evaluation.  He denies any headache at this time, changes in vision, slurred speech, facial droop, unilateral weakness or numbness, or vertigo.  Patient endorses compliance with Plavix and aspirin.  Denies using any cocaine or alcohol since leaving the hospital.  Continues to smoke however has been able to cut down to 4 cigarettes a day.  Past Medical History:  Diagnosis Date  . Stroke [facility])     Patient Active Problem List   Diagnosis Date Noted  . Essential (primary) hypertension 03/23/2018  . Hyperlipidemia 03/23/2018  . Alcohol dependence (HCC) 03/23/2018  . Cocaine abuse (HCC) 03/23/2018  . Tobacco abuse disorder 03/23/2018  . Stroke (HCC) 03/18/2018  . Acute ischemic stroke (HCC) 03/18/2018    Past Surgical History:  Procedure Laterality Date  . IR ANGIO INTRA EXTRACRAN SEL COM CAROTID INNOMINATE BILAT MOD SED  03/18/2018  . IR ANGIO VERTEBRAL SEL VERTEBRAL BILAT MOD SED  03/18/2018  . RADIOLOGY WITH ANESTHESIA N/A 03/18/2018   Procedure: IR WITH ANESTHESIA;  Surgeon: Radiologist, Medication, MD;  Location: [facility] OR;  Service:  Radiology;  Laterality: N/A;    Prior to Admission medications   Medication Sig Start Date End Date Taking? Authorizing Provider  aspirin EC 325 MG EC tablet Take 1 tablet (325 mg total) by mouth daily. 03/20/18   Rinehuls, Kinnie Scales, PA-C  atorvastatin (LIPITOR) 40 MG tablet Take 1 tablet (40 mg total) by mouth daily at 6 PM. 03/19/18   Rinehuls, Kinnie Scales, PA-C  clopidogrel (PLAVIX) 75 MG tablet Take 1 tablet (75 mg total) by mouth daily. 03/20/18   Rinehuls, Kinnie Scales, PA-C  losartan (COZAAR) 50 MG tablet Take 0.5 tablets (25 mg total) by mouth daily. 03/23/18   Marjie Skiff, NP    Allergies Patient has no known allergies.  Family History  Problem Relation Age of Onset  . Hypertension Mother   . Hypertension Father   . Glaucoma Father   . Stroke Maternal Grandmother   . Cerebral aneurysm Maternal Grandfather     Social History Social History   Tobacco Use  . Smoking status: Current Every Day Smoker    Packs/day: 1.00    Years: 25.00    Pack years: 25.00    Types: Cigarettes  . Smokeless tobacco: Never Used  Substance Use Topics  . Alcohol use: Yes    Comment: 6 pack a day, but is trying to cut back  . Drug use: Yes    Types: Cocaine, Marijuana    Comment: 1.5 weeks ago used cocaine and MJ use yesterday    Review of Systems  Constitutional: Negative for fever. Eyes: Negative for visual changes. ENT: Negative for sore throat.  Neck: No neck pain  Cardiovascular: Negative for chest pain. Respiratory: Negative for shortness of breath. Gastrointestinal: Negative for abdominal pain, vomiting or diarrhea. Genitourinary: Negative for dysuria. Musculoskeletal: Negative for back pain. Skin: Negative for rash. Neurological: + HA and R temporal region numbness Psych: No SI or HI  ____________________________________________   PHYSICAL EXAM:  VITAL SIGNS: Vitals:   03/28/18 1700 03/28/18 1731  BP: 123/84 121/81  Pulse: 71 64  Resp:    SpO2: 99% 98%    Constitutional: Alert and oriented. Well appearing and in no apparent distress. HEENT:      Head: Normocephalic and atraumatic.         Eyes: Conjunctivae are normal. Sclera is non-icteric.       Mouth/Throat: Mucous membranes are moist.       Neck: Supple with no signs of meningismus. Cardiovascular: Regular rate and rhythm. No murmurs, gallops, or rubs. 2+ symmetrical distal pulses are present in all extremities. No JVD. Respiratory: Normal respiratory effort. Lungs are clear to auscultation bilaterally. No wheezes, crackles, or rhonchi.  Gastrointestinal: Soft, non tender, and non distended with positive bowel sounds. No rebound or guarding. Musculoskeletal: Nontender with normal range of motion in all extremities. No edema, cyanosis, or erythema of extremities. Neurologic: Normal speech and language. A & O x3, PERRL, EOMI, no nystagmus, CN II-XII intact, motor testing reveals good tone and bulk throughout. There is no evidence of pronator drift or dysmetria. Muscle strength is 5/5 throughout. Sensory examination is intact. Gait is normal. Skin: Skin is warm, dry and intact. No rash noted. Psychiatric: Mood and affect are normal. Speech and behavior are normal.  ____________________________________________   LABS (all labs ordered are listed, but only abnormal results are displayed)  Labs Reviewed  BASIC METABOLIC PANEL - Abnormal; Notable for the following components:      Result Value   Glucose, Bld 105 (*)    All other components within normal limits  CBC  PROTIME-INR  APTT   ____________________________________________  EKG  ED ECG REPORT I, Nita Sickle, the attending physician, personally viewed and interpreted this ECG.  Normal sinus rhythm, rate of 80, normal intervals, normal axis, no ST elevations or depressions. Normal EKG ____________________________________________  RADIOLOGY  I have personally reviewed the images performed during this visit and I  agree with the Radiologist's read.   Interpretation by Radiologist:  Ct Head Wo Contrast  Result Date: 03/28/2018 CLINICAL DATA:  42 y/o M; right temporal paresthesia 2 nights ago. History of right-sided dissection 2 weeks ago. EXAM: CT HEAD WITHOUT CONTRAST TECHNIQUE: Contiguous axial images were obtained from the base of the skull through the vertex without intravenous contrast. COMPARISON:  03/18/2018 CT head. 03/19/2018 MRI head. FINDINGS: Brain: No evidence of new acute infarction, hydrocephalus, extra-axial collection or mass lesion/mass effect. Very small hypodensities are present corresponding to infarcts on prior MRI of the brain. Tiny volume of subarachnoid hemorrhage is present over the right cerebral convexity (series 2, image 24, series 4, image 27, series 5, image 16). Vascular: No hyperdense vessel or unexpected calcification. Skull: Normal. Negative for fracture or focal lesion. Sinuses/Orbits: No acute finding. Other: None. IMPRESSION: New tiny volume of subarachnoid hemorrhage over right cerebral convexity from 03/18/2018. No additional acute abnormality identified. These results were called by telephone at the time of interpretation on 03/28/2018 at 2:04 pm to Dr. Lenard Lance, who verbally acknowledged these results. Electronically Signed   By: Mitzi Hansen M.D.   On: 03/28/2018 14:08    ____________________________________________   PROCEDURES  Procedure(s) performed: None Procedures Critical Care performed: yes  CRITICAL CARE Performed by: Nita Sickle  ?  Total critical care time: 40 min  Critical care time was exclusive of separately billable procedures and treating other patients.  Critical care was necessary to treat or prevent imminent or life-threatening deterioration.  Critical care was time spent personally by me on the following activities: development of treatment plan with patient and/or surrogate as well as nursing, discussions with  consultants, evaluation of patient's response to treatment, examination of patient, obtaining history from patient or surrogate, ordering and performing treatments and interventions, ordering and review of laboratory studies, ordering and review of radiographic studies, pulse oximetry and re-evaluation of patient's condition.  ____________________________________________   INITIAL IMPRESSION / ASSESSMENT AND PLAN / ED COURSE   42 y.o. 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  Patient is neurologically intact, normal vital signs with no severe hypertension.  Head CT show small amount of subarachnoid hemorrhage over the right cerebral convexity which is new when compared to prior.  GCS of 15. Consult to [facility] Neurology. Spoke with Dr. Lovell Sheehan, neurosurgeon at [facility] who refused patient's transfer and recommended consultation with [facility] Neurosurgery. I explained to him that patient was recently dc from [facility] and the best thing would be to transfer him back to [facility] for continuity of care, but he again refused to accept patient. I spoke with Dr. Myer Haff, our NSG from [facility] who recommended keeping patient on ASA, dc plavix, neurology consult, and repeat head CT in 6 hours and if patient remains stable with no changes on size of SAH on CT he can be discharged to f/u with [facility] Neurology.  3:44 PM on 03/28/2018 -----------------------------------------  Another attempt was made to consult Neuro ICU at [facility] however their neurologist refused to speak with me and told [facility] to call our neurologist Dr. Thad Ranger, who then contacted me. Discussed with Dr. Thad Ranger from Neurology who agrees with stopping Plavix, keeping patient on ASA, and dc with outpatient f/u if repeat head CT is unchanged.    Clinical  Course as of Mar 28 1829  Mon Mar 28, 2018  1610 Patient remains GCS of 15, neurologically intact, repeat head CT and CTA per Dr. Osborne Oman recommendation at [facility].  Plan discussed with Dr. Roxan Hockey.   [CV]    Clinical Course User Index [CV] Don Perking [place], MD   _________________________   As part of my medical decision making, I reviewed the following data within the electronic MEDICAL RECORD NUMBER Nursing notes reviewed and incorporated, Labs reviewed , EKG interpreted , Old EKG reviewed, Old chart reviewed, Radiograph reviewed , A consult was requested and obtained from this/these consultant(s) Neurology and neurosurgery, Notes from prior ED visits and [place] Controlled Substance Database    Pertinent labs & imaging results that were available during my care of the patient were reviewed by me and considered in my medical decision making (see chart for details).    ____________________________________________   FINAL CLINICAL IMPRESSION(S) / ED DIAGNOSES  Final diagnoses:  SAH (subarachnoid hemorrhage) (HCC)      NEW MEDICATIONS STARTED DURING THIS VISIT:  ED Discharge Orders    None       Note:  This document was prepared using Dragon voice recognition software and may include unintentional dictation errors.    Don Perking,  [place], MD 03/28/18 [phone]

## 2018-03-28 NOTE — ED Notes (Signed)
Speech clear, neurologically intact ambulatory without assistance.

## 2018-03-29 NOTE — Telephone Encounter (Signed)
Error   This encounter was created in error - please disregard. 

## 2018-03-30 ENCOUNTER — Other Ambulatory Visit: Payer: Self-pay

## 2018-03-30 NOTE — Patient Outreach (Signed)
[facility]) Care Management  03/30/2018  Robert Jacobson 1976-03-17 [id number]   EMMI- Stroke not on APL RED ON EMMI ALERT Day # 6 Date: 03/29/18 Red Alert Reason: smoked or been around smoke? yes  Outreach attempt: spoke with patient.  Discussed red alert.  Patient reports that he is smoking but has cut back to about 4 cigarettes per day.  Patient states he will see PCP on Friday and he states he will be discussing possible patch to help with stopping to smoke.  Patient has follow up appointments with neurologist and interventional radiologist coming up as well.  Patient denies any problems with transportation.  Patient denies any needs or questions.     Plan: RN CM will close case.    Bary Leriche, RN, MSN [facility] Care Management Care Management Coordinator Direct Line [phone] Toll Free: [phone]  Fax: [phone]

## 2018-04-01 ENCOUNTER — Encounter: Payer: Self-pay | Admitting: Nurse Practitioner

## 2018-04-01 ENCOUNTER — Ambulatory Visit (INDEPENDENT_AMBULATORY_CARE_PROVIDER_SITE_OTHER): Payer: 59 | Admitting: Nurse Practitioner

## 2018-04-01 DIAGNOSIS — I609 Nontraumatic subarachnoid hemorrhage, unspecified: Secondary | ICD-10-CM | POA: Insufficient documentation

## 2018-04-01 DIAGNOSIS — G4700 Insomnia, unspecified: Secondary | ICD-10-CM | POA: Insufficient documentation

## 2018-04-01 DIAGNOSIS — I6340 Cerebral infarction due to embolism of unspecified cerebral artery: Secondary | ICD-10-CM

## 2018-04-01 DIAGNOSIS — I10 Essential (primary) hypertension: Secondary | ICD-10-CM | POA: Diagnosis not present

## 2018-04-01 DIAGNOSIS — F17210 Nicotine dependence, cigarettes, uncomplicated: Secondary | ICD-10-CM

## 2018-04-01 DIAGNOSIS — F5101 Primary insomnia: Secondary | ICD-10-CM

## 2018-04-01 HISTORY — DX: Nontraumatic subarachnoid hemorrhage, unspecified: I60.9

## 2018-04-01 MED ORDER — TRAZODONE HCL 50 MG PO TABS: 25.0000 mg | ORAL_TABLET | Freq: Every evening | ORAL | 3 refills | Status: DC | PRN

## 2018-04-01 NOTE — Assessment & Plan Note (Signed)
Chronic.  Continue to encourage tobacco cessation.

## 2018-04-01 NOTE — Assessment & Plan Note (Signed)
Ongoing.  Follow-up with neurology scheduled.  Continue to collaborate with neurology.

## 2018-04-01 NOTE — Patient Instructions (Signed)
Insomnia Insomnia is a sleep disorder that makes it difficult to fall asleep or to stay asleep. Insomnia can cause tiredness (fatigue), low energy, difficulty concentrating, mood swings, and poor performance at work or school. There are three different ways to classify insomnia:  Difficulty falling asleep.  Difficulty staying asleep.  Waking up too early in the morning.  Any type of insomnia can be long-term (chronic) or short-term (acute). Both are common. Short-term insomnia usually lasts for three months or less. Chronic insomnia occurs at least three times a week for longer than three months. What are the causes? Insomnia may be caused by another condition, situation, or substance, such as:  Anxiety.  Certain medicines.  Gastroesophageal reflux disease (GERD) or other gastrointestinal conditions.  Asthma or other breathing conditions.  Restless legs syndrome, sleep apnea, or other sleep disorders.  Chronic pain.  Menopause. This may include hot flashes.  Stroke.  Abuse of alcohol, tobacco, or illegal drugs.  Depression.  Caffeine.  Neurological disorders, such as Alzheimer disease.  An overactive thyroid (hyperthyroidism).  The cause of insomnia may not be known. What increases the risk? Risk factors for insomnia include:  Gender. Women are more commonly affected than men.  Age. Insomnia is more common as you get older.  Stress. This may involve your professional or personal life.  Income. Insomnia is more common in people with lower income.  Lack of exercise.  Irregular work schedule or night shifts.  Traveling between different time zones.  What are the signs or symptoms? If you have insomnia, trouble falling asleep or trouble staying asleep is the main symptom. This may lead to other symptoms, such as:  Feeling fatigued.  Feeling nervous about going to sleep.  Not feeling rested in the morning.  Having trouble concentrating.  Feeling  irritable, anxious, or depressed.  How is this treated? Treatment for insomnia depends on the cause. If your insomnia is caused by an underlying condition, treatment will focus on addressing the condition. Treatment may also include:  Medicines to help you sleep.  Counseling or therapy.  Lifestyle adjustments.  Follow these instructions at home:  Take medicines only as directed by your health care provider.  Keep regular sleeping and waking hours. Avoid naps.  Keep a sleep diary to help you and your health care provider figure out what could be causing your insomnia. Include: ? When you sleep. ? When you wake up during the night. ? How well you sleep. ? How rested you feel the next day. ? Any side effects of medicines you are taking. ? What you eat and drink.  Make your bedroom a comfortable place where it is easy to fall asleep: ? Put up shades or special blackout curtains to block light from outside. ? Use a white noise machine to block noise. ? Keep the temperature cool.  Exercise regularly as directed by your health care provider. Avoid exercising right before bedtime.  Use relaxation techniques to manage stress. Ask your health care provider to suggest some techniques that may work well for you. These may include: ? Breathing exercises. ? Routines to release muscle tension. ? Visualizing peaceful scenes.  Cut back on alcohol, caffeinated beverages, and cigarettes, especially close to bedtime. These can disrupt your sleep.  Do not overeat or eat spicy foods right before bedtime. This can lead to digestive discomfort that can make it hard for you to sleep.  Limit screen use before bedtime. This includes: ? Watching TV. ? Using your smartphone, tablet, and   computer.  Stick to a routine. This can help you fall asleep faster. Try to do a quiet activity, brush your teeth, and go to bed at the same time each night.  Get out of bed if you are still awake after 15 minutes  of trying to sleep. Keep the lights down, but try reading or doing a quiet activity. When you feel sleepy, go back to bed.  Make sure that you drive carefully. Avoid driving if you feel very sleepy.  Keep all follow-up appointments as directed by your health care provider. This is important. Contact a health care provider if:  You are tired throughout the day or have trouble in your daily routine due to sleepiness.  You continue to have sleep problems or your sleep problems get worse. Get help right away if:  You have serious thoughts about hurting yourself or someone else. This information is not intended to replace advice given to you by your health care provider. Make sure you discuss any questions you have with your health care provider. Document Released: 05/22/2000 Document Revised: 10/25/2015 Document Reviewed: 02/23/2014 Elsevier Interactive Patient Education  2018 Elsevier Inc. Trazodone tablets What is this medicine? TRAZODONE (TRAZ oh done) is used to treat depression. This medicine may be used for other purposes; ask your health care provider or pharmacist if you have questions. COMMON BRAND NAME(S): Desyrel What should I tell my health care provider before I take this medicine? They need to know if you have any of these conditions: -attempted suicide or thinking about it -bipolar disorder -bleeding problems -glaucoma -heart disease, or previous heart attack -irregular heart beat -kidney or liver disease -low levels of sodium in the blood -an unusual or allergic reaction to trazodone, other medicines, foods, dyes or preservatives -pregnant or trying to get pregnant -breast-feeding How should I use this medicine? Take this medicine by mouth with a glass of water. Follow the directions on the prescription label. Take this medicine shortly after a meal or a light snack. Take your medicine at regular intervals. Do not take your medicine more often than directed. Do not  stop taking this medicine suddenly except upon the advice of your doctor. Stopping this medicine too quickly may cause serious side effects or your condition may worsen. A special MedGuide will be given to you by the pharmacist with each prescription and refill. Be sure to read this information carefully each time. Talk to your pediatrician regarding the use of this medicine in children. Special care may be needed. Overdosage: If you think you have taken too much of this medicine contact a poison control center or emergency room at once. NOTE: This medicine is only for you. Do not share this medicine with others. What if I miss a dose? If you miss a dose, take it as soon as you can. If it is almost time for your next dose, take only that dose. Do not take double or extra doses. What may interact with this medicine? Do not take this medicine with any of the following medications: -certain medicines for fungal infections like fluconazole, itraconazole, ketoconazole, posaconazole, voriconazole -cisapride -dofetilide -dronedarone -linezolid -MAOIs like Carbex, Eldepryl, Marplan, Nardil, and Parnate -mesoridazine -methylene blue (injected into a vein) -pimozide -saquinavir -thioridazine -ziprasidone This medicine may also interact with the following medications: -alcohol -antiviral medicines for HIV or AIDS -aspirin and aspirin-like medicines -barbiturates like phenobarbital -certain medicines for blood pressure, heart disease, irregular heart beat -certain medicines for depression, anxiety, or psychotic disturbances -certain medicines for   migraine headache like almotriptan, eletriptan, frovatriptan, naratriptan, rizatriptan, sumatriptan, zolmitriptan -certain medicines for seizures like carbamazepine and phenytoin -certain medicines for sleep -certain medicines that treat or prevent blood clots like dalteparin, enoxaparin, warfarin -digoxin -fentanyl -lithium -NSAIDS, medicines for  pain and inflammation, like ibuprofen or naproxen -other medicines that prolong the QT interval (cause an abnormal heart rhythm) -rasagiline -supplements like St. John's wort, kava kava, valerian -tramadol -tryptophan This list may not describe all possible interactions. Give your health care provider a list of all the medicines, herbs, non-prescription drugs, or dietary supplements you use. Also tell them if you smoke, drink alcohol, or use illegal drugs. Some items may interact with your medicine. What should I watch for while using this medicine? Tell your doctor if your symptoms do not get better or if they get worse. Visit your doctor or health care professional for regular checks on your progress. Because it may take several weeks to see the full effects of this medicine, it is important to continue your treatment as prescribed by your doctor. Patients and their families should watch out for new or worsening thoughts of suicide or depression. Also watch out for sudden changes in feelings such as feeling anxious, agitated, panicky, irritable, hostile, aggressive, impulsive, severely restless, overly excited and hyperactive, or not being able to sleep. If this happens, especially at the beginning of treatment or after a change in dose, call your health care professional. You may get drowsy or dizzy. Do not drive, use machinery, or do anything that needs mental alertness until you know how this medicine affects you. Do not stand or sit up quickly, especially if you are an older patient. This reduces the risk of dizzy or fainting spells. Alcohol may interfere with the effect of this medicine. Avoid alcoholic drinks. This medicine may cause dry eyes and blurred vision. If you wear contact lenses you may feel some discomfort. Lubricating drops may help. See your eye doctor if the problem does not go away or is severe. Your mouth may get dry. Chewing sugarless gum, sucking hard candy and drinking plenty  of water may help. Contact your doctor if the problem does not go away or is severe. What side effects may I notice from receiving this medicine? Side effects that you should report to your doctor or health care professional as soon as possible: -allergic reactions like skin rash, itching or hives, swelling of the face, lips, or tongue -elevated mood, decreased need for sleep, racing thoughts, impulsive behavior -confusion -fast, irregular heartbeat -feeling faint or lightheaded, falls -feeling agitated, angry, or irritable -loss of balance or coordination -painful or prolonged erections -restlessness, pacing, inability to keep still -suicidal thoughts or other mood changes -tremors -trouble sleeping -seizures -unusual bleeding or bruising Side effects that usually do not require medical attention (report to your doctor or health care professional if they continue or are bothersome): -change in sex drive or performance -change in appetite or weight -constipation -headache -muscle aches or pains -nausea This list may not describe all possible side effects. Call your doctor for medical advice about side effects. You may report side effects to FDA at 1-800-FDA-1088. Where should I keep my medicine? Keep out of the reach of children. Store at room temperature between 15 and 30 degrees C (59 to 86 degrees F). Protect from light. Keep container tightly closed. Throw away any unused medicine after the expiration date. NOTE: This sheet is a summary. It may not cover all possible information. If you have questions   about this medicine, talk to your doctor, pharmacist, or health care provider.  2018 Elsevier/Gold Standard (2015-10-24 16:57:05)  

## 2018-04-01 NOTE — Assessment & Plan Note (Signed)
New diagnosis as of ER visit 03/28/18.  Noted " subarachnoid hemorrhage over right cerebral convexity".  Continue to collaborate with neurology.  Has upcoming appointment and repeat CT scheduled.

## 2018-04-01 NOTE — Assessment & Plan Note (Signed)
Chronic, improved with Losartan.  Patient to continue taking BP at home.  Encouraged to continue smoking cessation.

## 2018-04-01 NOTE — Assessment & Plan Note (Signed)
Secondary to alcohol reduction.  Initiate Trazodone 25MG  PO QHS as needed for sleep.  Monitor use and adjust dose as needed.  Not to use alcohol with this medication, patient is aware.

## 2018-04-01 NOTE — Progress Notes (Signed)
BP 114/82 (BP Location: Left Arm, Patient Position: Sitting, Cuff Size: Normal)   Pulse 91   Temp 98.1 F (36.7 C) (Oral)   Ht 5\' 9"  (1.753 m)   Wt 147 lb 3.2 oz (66.8 kg)   SpO2 100%   BMI 21.74 kg/m    Subjective:    Patient ID: Robert Jacobson, male    DOB: 1976/01/07, 42 y.o.   MRN: [id number]  HPI: Robert Jacobson is a 42 y.o. male  Chief Complaint  Patient presents with  . Hospitalization Follow-up    Stroke  . Insomnia    Patinet states he's having trouble sleeping.    POST HOSPITAL FOLLOW-UP: Went to ER on 03/28/18 for headache and numbness with CT noting new subarachnoid hemorrhage, performed repeat CT hours later in ER and no change in size of hemorrhage.  Recently had stroke at beginning of October. At recent ER visit they discontinued Plavix and maintained his ASA.  He was discharged.  He reports no further headaches.  States he continues to have mild numbness to right temple.  Has initial appointment with neurology and a CT scan next week.  Denies any loss of function limbs, paresthesia, headache, bleeding episodes, confusion, or difficulty with speech.  He has not used cocaine since prior to initial hospitalization, which provider praised him for.  Has been cutting back on cigarettes and alcohol use at home.  HYPERTENSION: Started on Losartan at last visit, which continues.  BP in ER was below goal and he brought in log of BP readings over past 1 1/2 weeks showing range from 100/74 to 155/100 (majority have been below goal, with some outliers).  He reports elevations have been after he smoked a cigarette, which is one reason he is now attempting to cut back.  Encouraged him to continue performing BP readings at home and bring to physical, to perform prior to smoking cigarette.  He reports no ADR with Losartan. States he has felt better and has had improvement in headaches since initiation of medication.  INSOMNIA: Is having difficulty with sleeping.  States  he used to drink a six-pack prior to bed, but since stroke he has been cutting "way back" and now is having difficulty with sleep as he is not drinking before bed.  Has tried Melatonin, total of 9MG , with no success.  Has tried various sleep hygiene techniques and continues to have difficulty falling asleep.  Once asleep he reports he is able to stay asleep with no wakefulness.  Discussed initiation of small dose Trazodone, educated him on medication and risks/benefits.  Informed him he is not to drink alcohol with this medication, which he agrees to.  He states he has used Trazodone in the past and it "worked really well for me".  Does not want to taking anything that may "be addictive because I am trying to be clean".   NICOTINE DEPENDENCE: Reports he has been working on cutting back on cigarettes throughout day, currently at less than 1/2 pack.  Discussed and educated patient on cessation products.  At this time he wishes not to utilize medication or gum.  Encouraged to continue working towards cessation.  Relevant past medical, surgical, family and social history reviewed and updated as indicated. Interim medical history since our last visit reviewed. Allergies and medications reviewed and updated.  Review of Systems  Constitutional: Negative for activity change, fatigue and fever.  HENT: Negative.   Eyes: Negative.   Respiratory: Negative for cough, chest tightness, shortness  of breath and wheezing.   Cardiovascular: Negative for chest pain, palpitations and leg swelling.  Gastrointestinal: Negative for abdominal distention, abdominal pain, nausea and vomiting.  Endocrine: Negative for cold intolerance, polydipsia, polyphagia and polyuria.  Genitourinary: Negative for difficulty urinating.  Musculoskeletal: Negative for myalgias.  Skin: Negative.   Allergic/Immunologic: Negative.   Neurological: Positive for numbness. Negative for dizziness, syncope, facial asymmetry, speech difficulty,  weakness and headaches.       Numbness to right temple only  Hematological: Negative.   Psychiatric/Behavioral: Positive for sleep disturbance. Negative for behavioral problems and decreased concentration. The patient is not nervous/anxious.     Per HPI unless specifically indicated above     Objective:    BP 114/82 (BP Location: Left Arm, Patient Position: Sitting, Cuff Size: Normal)   Pulse 91   Temp 98.1 F (36.7 C) (Oral)   Ht 5\' 9"  (1.753 m)   Wt 147 lb 3.2 oz (66.8 kg)   SpO2 100%   BMI 21.74 kg/m   Wt Readings from Last 3 Encounters:  04/01/18 147 lb 3.2 oz (66.8 kg)  03/28/18 150 lb (68 kg)  03/23/18 150 lb (68 kg)    Physical Exam  Constitutional: He is oriented to person, place, and time. He appears well-developed and well-nourished.  HENT:  Head: Normocephalic and atraumatic.  Eyes: Pupils are equal, round, and reactive to light. Conjunctivae and EOM are normal.  Neck: Trachea normal and normal range of motion. Neck supple. No JVD present. Carotid bruit is not present. No thyromegaly present.  Cardiovascular: Normal rate, regular rhythm, S1 normal, S2 normal, normal heart sounds and intact distal pulses.  Pulmonary/Chest: Effort normal and breath sounds normal. No accessory muscle usage.  Abdominal: Soft. Bowel sounds are normal. There is no splenomegaly or hepatomegaly.  Genitourinary: Rectum normal.  Musculoskeletal: Normal range of motion.  Lymphadenopathy:    He has no cervical adenopathy.  Neurological: He is alert and oriented to person, place, and time. He has normal strength and normal reflexes. No cranial nerve deficit.  Reflex Scores:      Patellar reflexes are 2+ on the right side and 2+ on the left side. Skin: Skin is warm and dry. No rash noted.  Psychiatric: He has a normal mood and affect. His behavior is normal.    Results for orders placed or performed during the hospital encounter of 03/28/18  CBC  Result Value Ref Range   WBC 6.2 4.0 - 10.5  K/uL   RBC 4.70 4.22 - 5.81 MIL/uL   Hemoglobin 14.9 13.0 - 17.0 g/dL   HCT 16.1 09.6 - 04.5 %   MCV 93.6 80.0 - 100.0 fL   MCH 31.7 26.0 - 34.0 pg   MCHC 33.9 30.0 - 36.0 g/dL   RDW 40.9 81.1 - 91.4 %   Platelets 319 150 - 400 K/uL   nRBC 0.0 0.0 - 0.2 %  Basic metabolic panel  Result Value Ref Range   Sodium 140 135 - 145 mmol/L   Potassium 3.8 3.5 - 5.1 mmol/L   Chloride 101 98 - 111 mmol/L   CO2 27 22 - 32 mmol/L   Glucose, Bld 105 (H) 70 - 99 mg/dL   BUN 14 6 - 20 mg/dL   Creatinine, Ser 7.82 0.61 - 1.24 mg/dL   Calcium 9.1 8.9 - 95.6 mg/dL   GFR calc non Af Amer >60 >60 mL/min   GFR calc Af Amer >60 >60 mL/min   Anion gap 12 5 - 15  Protime-INR  Result Value Ref Range   Prothrombin Time 11.8 11.4 - 15.2 seconds   INR 0.87   APTT  Result Value Ref Range   aPTT 27 24 - 36 seconds      Assessment & Plan:   Problem List Items Addressed This Visit      Cardiovascular and Mediastinum   Stroke [facility])    Ongoing.  Follow-up with neurology scheduled.  Continue to collaborate with neurology.      Essential (primary) hypertension    Chronic, improved with Losartan.  Patient to continue taking BP at home.  Encouraged to continue smoking cessation.        Nervous and Auditory   Subarachnoid hemorrhage [facility])    New diagnosis as of ER visit 03/28/18.  Noted " subarachnoid hemorrhage over right cerebral convexity".  Continue to collaborate with neurology.  Has upcoming appointment and repeat CT scheduled.        Other   Nicotine dependence, cigarettes, uncomplicated    Chronic.  Continue to encourage tobacco cessation.      Insomnia    Secondary to alcohol reduction.  Initiate Trazodone 25MG  PO QHS as needed for sleep.  Monitor use and adjust dose as needed.  Not to use alcohol with this medication, patient is aware.         Spent 5 minutes discussing tobacco cessation and products with patient at this visit.  Follow up plan: Return if symptoms worsen or fail to  improve.

## 2018-04-04 NOTE — Progress Notes (Signed)
[facility] [address] [place]. [place] [address] [phone]       OFFICE FOLLOW UP NOTE  Mr. Robert Jacobson Date of Birth:  1975/09/04 Medical Record Number:  [id number]   Reason for Referral:  hospital stroke follow up  CHIEF COMPLAINT:  Chief Complaint  Patient presents with  . Hospitalization Follow-up    stroke; head still numb on the right side, no other complaints, on aspirin 325 mg daily.  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 History obtained from patient and chart review. Reviewed all radiology images and labs personally.  Mr.Robert L Edwardsis a 42 y.o.malewith history of tobacco use and substance abuse,who presented withsudden onset of blindness OD, syncope, and headache.  Patient initially presented to [facility] ED and per notes, he was watching TV when he suddenly lost having his vision on the right side and while ambulating to the bathroom to assess his eye, he passed out.  He did not know exactly the amount of time but believes it was approximately 2 minutes and when he finally woke up he was experience headache on the right side.  Patient stated that he went to bed and woke up the next morning with a very bad headache on the right side which was worse.  Does have a history of marijuana and cocaine use along with tobacco use.  Initial CT head was negative for acute infarct.  CTA head and neck showed distal cervical right internal carotid artery occlusion likely representing dissection with partial filling defect of the distal right M1 segment extending into the anterior right M2 segment compatible with thrombus.  Patient was transferred to [facility] for further management. He did not receive IV t-PA due to late presentation.  Cerebral angiogram  showed right ICA occlusion in the petrous segment and right MCA patent from collaterals including A-comm and P-comm.  MRI brain reviewed and showed 2 right MCA territory punctate infarcts.  2D echo showed an EF of 60 to 65%.  Patient was not on antithrombotic PTA and recommended DAPT with aspirin 325 mg and Plavix for 3 months due to right ICA dissection and recommended repeat CTA head and neck in 2 to 3 months.  Etiology of infarct embolic pattern related to right distal cervical ICA dissection.  LDL 120 and recommended initiating atorvastatin 40 mg daily.  UDS was positive for cocaine and THC and cocaine cessation education provided along with smoking cessation counseling provided.  As all symptoms resolved, patient was discharged home in stable condition without therapy needs.  Patient return to [facility] ED on 03/28/2018 due to headache and numbness in the right temporal region.  Per notes, he reported 2 days ago he had a severe headache located on the right side of his head and he was evaluated at his PCP who advised him to be evaluated in the ED.  CT head reviewed and showed small subarachnoid hemorrhage over the right cerebellar convexity.  Per [facility] notes, attempted transfer to [facility] but neurosurgery refused transfer and recommended consult with [facility] neurosurgery despite being recently seen at [facility] for recent infarct.  [facility] neurosurgery was consulted and was recommended to continue aspirin but DC Plavix with repeat imaging.  Repeat CT head showed stabilized hemorrhage and unchanged appearance of right cervical  ICA dissection.  Patient was discharged home with recommendations of neurology follow-up.  Patient is being seen today for hospital follow-up.  Patient states overall he is been doing well.  He denies any recurrent visual symptoms, headaches, or any other neurological symptoms except for mild continuous numbness/tingling right temple area.  He does state that has improved some but does  have waxing/waning of symptoms especially towards the end of the day when he is more fatigued.  He does have an appointment with Dr. Corliss Skains on 04/12/2018 for follow-up angiogram.  He has continued on aspirin only without side effects of bleeding or bruising.  Continues on Lipitor without side effects myalgias.  Blood pressure today 106/70 but as he does monitor this at home, typically SBP 140.  He does continue to smoke cigarettes but is currently smoking 0.5 pack where he was smoking 1.5 packs prior and he does state he plans on continuing decreasing.  He has also been able to decrease alcohol consumption down to approximately 4 beers per night where prior drinking 8-12 per night.  He declines any substance use since hospital discharge.  He has returned to work and all his normal activities without complications.  No further concerns at this time.  Denies new or worsening stroke/TIA symptoms.     ROS:   14 system review of systems performed and negative with exception of numbness  PMH:  Past Medical History:  Diagnosis Date  . Hypertension   . Stroke [facility])     PSH:  Past Surgical History:  Procedure Laterality Date  . IR ANGIO INTRA EXTRACRAN SEL COM CAROTID INNOMINATE BILAT MOD SED  03/18/2018  . IR ANGIO VERTEBRAL SEL VERTEBRAL BILAT MOD SED  03/18/2018  . RADIOLOGY WITH ANESTHESIA N/A 03/18/2018   Procedure: IR WITH ANESTHESIA;  Surgeon: Radiologist, Medication, MD;  Location: [facility] OR;  Service: Radiology;  Laterality: N/A;    Social History:  Social History   Socioeconomic History  . Marital status: Divorced    Spouse name: Not on file  . Number of children: 2  . Years of education: 47  . Highest education level: High school graduate  Occupational History  . Not on file  Social Needs  . Financial resource strain: Not on file  . Food insecurity:    Worry: Not on file    Inability: Not on file  . Transportation needs:    Medical: Not on file    Non-medical: Not on file    Tobacco Use  . Smoking status: Current Every Day Smoker    Packs/day: 1.00    Years: 25.00    Pack years: 25.00    Types: Cigarettes  . Smokeless tobacco: Never Used  . Tobacco comment: 04/05/18 <1 ppd  Substance and Sexual Activity  . Alcohol use: Yes    Alcohol/week: 35.0 standard drinks    Types: 35 Cans of beer per week    Comment: 5 beers per day; 04/05/18 now 3-4 beers per day, some days none.  . Drug use: Yes    Types: Cocaine, Marijuana    Comment: Hasn't used cocaine since before stroke. Still using Marijuana. 04/01/18  . Sexual activity: Yes  Lifestyle  . Physical activity:    Days per week: Not on file    Minutes per session: Not on file  . Stress: Not on file  Relationships  . Social connections:    Talks on phone: Not on file    Gets together: Not on file    Attends  religious service: Not on file    Active member of club or organization: Not on file    Attends meetings of clubs or organizations: Not on file    Relationship status: Not on file  . Intimate partner violence:    Fear of current or ex partner: Not on file    Emotionally abused: Not on file    Physically abused: Not on file    Forced sexual activity: Not on file  Other Topics Concern  . Not on file  Social History Narrative   Lives at home alone   Caffeine use: 3-4 cups of coffee in the morning and 4 cups of coca cola daily    Family History:  Family History  Problem Relation Age of Onset  . Hypertension Mother   . Hypertension Father   . Glaucoma Father   . Stroke Maternal Grandmother   . Cerebral aneurysm Maternal Grandfather     Medications:   Current Outpatient Medications on File Prior to Visit  Medication Sig Dispense Refill  . aspirin EC 325 MG EC tablet Take 1 tablet (325 mg total) by mouth daily. 30 tablet 0  . atorvastatin (LIPITOR) 40 MG tablet Take 1 tablet (40 mg total) by mouth daily at 6 PM. 30 tablet 3  . losartan (COZAAR) 50 MG tablet Take 0.5 tablets (25 mg total) by  mouth daily. 90 tablet 3  . traZODone (DESYREL) 50 MG tablet Take 0.5 tablets (25 mg total) by mouth at bedtime as needed for sleep. 30 tablet 3   No current facility-administered medications on file prior to visit.     Allergies:  No Known Allergies   Physical Exam  Vitals:   04/05/18 0730 04/05/18 0735  BP: 106/70 114/76  Pulse: 77 80  Weight: 146 lb (66.2 kg)   Height: 5\' 9"  (1.753 m)    Body mass index is 21.56 kg/m. No exam data present  General: well developed, well nourished, pleasant middle-aged Caucasian male, seated, in no evident distress Head: head normocephalic and atraumatic.   Neck: supple with no carotid or supraclavicular bruits Cardiovascular: regular rate and rhythm, no murmurs Musculoskeletal: no deformity Skin:  no rash/petichiae Vascular:  Normal pulses all extremities  Neurologic Exam Mental Status: Awake and fully alert. Oriented to place and time. Recent and remote memory intact. Attention span, concentration and fund of knowledge appropriate. Mood and affect appropriate.  Cranial Nerves: Fundoscopic exam reveals sharp disc margins. Pupils equal, briskly reactive to light. Extraocular movements full without nystagmus. Visual fields full to confrontation. Hearing intact. Facial sensation intact. Face, tongue, palate moves normally and symmetrically.  Motor: Normal bulk and tone. Normal strength in all tested extremity muscles. Sensory.:  Decreased sensation right side of face compared to left side of face Coordination: Rapid alternating movements normal in all extremities. Finger-to-nose and heel-to-shin performed accurately bilaterally. Gait and Station: Arises from chair without difficulty. Stance is normal. Gait demonstrates normal stride length and balance . Able to heel, toe and tandem walk without difficulty.  Reflexes: 1+ and symmetric. Toes downgoing.    NIHSS  0 Modified Rankin  1    Diagnostic Data (Labs, Imaging, Testing)  Ct Angio  Head W Or Wo Contrast Ct Angio Neck W And/or Wo Contrast 03/18/2018 IMPRESSION:  1. 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  2.The right internal carotid artery is reconstituted at the skull base. This may represent retrograde flow from a more distal reconstitution.  3. Decreased intensity of contrast suggesting decreased  perfusion within the right internal carotid artery at the skull base through the ICA terminus.  4. Partial filling defect of the distal right M1 segment extending into the anterior right M2 segment compatible with thrombus.  5. No emergent large vessel occlusion.  6. Left MCA and bilateral ACA distributions are unremarkable.  7. No acute infarct.  MR Brain WO Contrast 03/19/2018 IMPRESSION: Two small foci of nonhemorrhagic acute infarction within the RIGHT MCA territory, as described above. Thrombosed RIGHT ICA due to dissection. Intracranial anteriorcirculation is patent from collateral flow. Premature for age atrophy. Remote brain substance loss RIGHT frontal lobe, likely closed head injury.  S/P 4 vessel cerebral arteriogram RT CFA approach. Findings . 1. Occluded RT ICAIn the petrous seg with a procx tappered severe preocclussive stenosis C/W dissection. 2.RT MCA and RT ACA collateralized from the Lt ICA via ACOM and via RT PCOM from the verts bilaterally.  Transthoracic Echocardiogram  03/19/2018 Study Conclusions - Left ventricle: The cavity size was normal. Wall thickness was normal. Systolic function was normal. The estimated ejection fraction was in the range of 60% to 65%. LV apical false tendon. Wall motion was normal; there were no regional wall motion abnormalities. Left ventricular diastolic function parameters were normal. - Left atrium: The atrium was normal in size. - Inferior vena cava: The vessel was normal in size. The respirophasic diameter changes were in the normal range (>=  50%), consistent with normal central venous pressure. Impressions: - Normal study.   CT HEAD WO CONTRAST [facility] ED readmission) 03/28/2018 IMPRESSION: New tiny volume of subarachnoid hemorrhage over right cerebral convexity from 03/18/2018. No additional acute abnormality Identified.  CT ANGIO HEAD W OR WO CONTRAST 03/28/2018 IMPRESSION: 1. No emergent large vessel occlusion or acute intracranial process. No aneurysm. 2. Unchanged appearance of RIGHT cervical ICA dissection and focal occlusion with reconstitution, incompletely imaged.    ASSESSMENT: MELCHOR KIRCHGESSNER is a 42 y.o. year old male here with right MCA territory punctate infarcts embolic pattern on 03/18/2018 secondary to right distal cervical ICA dissection. Vascular risk factors include tobacco and substance abuse, HLD and HTN.  Patient return to ED on 03/28/2018 with right cerebral convexity SAH.  Initially recommended DAPT due to right ICA dissection but recommended to discontinue Plavix and continue aspirin only.  Patient is being seen today for hospital follow-up and overall has been stable with only mild residual right temporal numbness/tingling.    PLAN: -Continue aspirin 325 mg daily  and atorvastatin for secondary stroke prevention -f/u with Dr. Corliss Skains on 04/12/2018 for f/u angio -F/u with PCP regarding your HLD and HTN management -Continue substance cessation along with decreasing use of tobacco and alcohol -continue to monitor BP at home and reviewed proper technique of obtaining blood pressure readings along with advising to bring blood pressure cuff with him to next provider appointment to ensure accuracy compared to manual blood pressure reading -advised to continue to stay active and maintain a healthy diet -Maintain strict control of hypertension with blood pressure goal below 130/90, diabetes with hemoglobin A1c goal below 6.5% and cholesterol with LDL cholesterol (bad cholesterol) goal below 70  mg/dL. I also advised the patient to eat a healthy diet with plenty of whole grains, cereals, fruits and vegetables, exercise regularly and maintain ideal body weight.  Follow up in 3 months or call earlier if needed   Greater than 50% of time during this 25 minute visit was spent on counseling,explanation of diagnosis of right MCA territory punctate infarcts and right cerebral  SAH, reviewing risk factor management of cervical dissection, HLD and HTN, planning of further management, discussion with patient and family and coordination of care    George Hugh, AGNP-BC  [facility] [address] [place], [place] [address]  Phone [phone] Fax [phone] Note: This document was prepared with digital dictation and possible smart phrase technology. Any transcriptional errors that result from this process are unintentional.

## 2018-04-05 ENCOUNTER — Encounter: Payer: Self-pay | Admitting: Adult Health

## 2018-04-05 ENCOUNTER — Ambulatory Visit (INDEPENDENT_AMBULATORY_CARE_PROVIDER_SITE_OTHER): Payer: 59 | Admitting: Adult Health

## 2018-04-05 VITALS — BP 114/76 | HR 80 | Ht 69.0 in | Wt 146.0 lb

## 2018-04-05 DIAGNOSIS — I10 Essential (primary) hypertension: Secondary | ICD-10-CM

## 2018-04-05 DIAGNOSIS — E782 Mixed hyperlipidemia: Secondary | ICD-10-CM | POA: Diagnosis not present

## 2018-04-05 DIAGNOSIS — I609 Nontraumatic subarachnoid hemorrhage, unspecified: Secondary | ICD-10-CM

## 2018-04-05 DIAGNOSIS — I63511 Cerebral infarction due to unspecified occlusion or stenosis of right middle cerebral artery: Secondary | ICD-10-CM

## 2018-04-05 NOTE — Patient Instructions (Signed)
Continue aspirin 325 mg daily  and lipitor  for secondary stroke prevention  Continue to follow up with PCP regarding cholesterol and blood pressure management   Continue to stay active and maintain a healthy diet  Continue to monitor blood pressure at home  Maintain strict control of hypertension with blood pressure goal below 130/90, diabetes with hemoglobin A1c goal below 6.5% and cholesterol with LDL cholesterol (bad cholesterol) goal below 70 mg/dL. I also advised the patient to eat a healthy diet with plenty of whole grains, cereals, fruits and vegetables, exercise regularly and maintain ideal body weight.  Followup in the future with me in 3 months or call earlier if needed       Thank you for coming to see [place] at [facility]. I hope we have been able to provide you high quality care today.  You may receive a patient satisfaction survey over the next few weeks. We would appreciate your feedback and comments so that we may continue to improve ourselves and the health of our patients.

## 2018-04-10 NOTE — Progress Notes (Signed)
I agree with the above plan 

## 2018-04-11 ENCOUNTER — Ambulatory Visit (HOSPITAL_COMMUNITY): Admission: RE | Admit: 2018-04-11 | Payer: 59 | Source: Ambulatory Visit

## 2018-04-11 ENCOUNTER — Ambulatory Visit (INDEPENDENT_AMBULATORY_CARE_PROVIDER_SITE_OTHER): Payer: 59 | Admitting: Nurse Practitioner

## 2018-04-11 ENCOUNTER — Encounter: Payer: Self-pay | Admitting: Nurse Practitioner

## 2018-04-11 VITALS — BP 120/76 | HR 75 | Temp 98.3°F | Ht 69.0 in | Wt 149.2 lb

## 2018-04-11 DIAGNOSIS — I10 Essential (primary) hypertension: Secondary | ICD-10-CM | POA: Diagnosis not present

## 2018-04-11 DIAGNOSIS — F17210 Nicotine dependence, cigarettes, uncomplicated: Secondary | ICD-10-CM | POA: Diagnosis not present

## 2018-04-11 DIAGNOSIS — I6340 Cerebral infarction due to embolism of unspecified cerebral artery: Secondary | ICD-10-CM

## 2018-04-11 DIAGNOSIS — F1410 Cocaine abuse, uncomplicated: Secondary | ICD-10-CM

## 2018-04-11 DIAGNOSIS — E782 Mixed hyperlipidemia: Secondary | ICD-10-CM

## 2018-04-11 DIAGNOSIS — F1020 Alcohol dependence, uncomplicated: Secondary | ICD-10-CM

## 2018-04-11 DIAGNOSIS — Z0000 Encounter for general adult medical examination without abnormal findings: Secondary | ICD-10-CM

## 2018-04-11 DIAGNOSIS — I609 Nontraumatic subarachnoid hemorrhage, unspecified: Secondary | ICD-10-CM | POA: Diagnosis not present

## 2018-04-11 MED ORDER — ASPIRIN 325 MG PO TBEC: 325.0000 mg | DELAYED_RELEASE_TABLET | Freq: Every day | ORAL | 3 refills | Status: DC

## 2018-04-11 NOTE — Assessment & Plan Note (Signed)
Chronic, continue to encourage cessation.  Not interested in medication at this time to assist with cessation.

## 2018-04-11 NOTE — Assessment & Plan Note (Signed)
Is working on cutting back, down to 4-5 beers at night on occasion.  Monitor closely and discuss rehab admission if worsening.  Encourage cessation of alcohol.

## 2018-04-11 NOTE — Assessment & Plan Note (Addendum)
Continues to abstain from cocaine use.  Obtain drug screen intermittently to check, next visit.

## 2018-04-11 NOTE — Assessment & Plan Note (Addendum)
Continue ASA and collaboration with neurology.  Has appointment for angio 04/12/18.  Goal BP <130/90 and LDL less than 70.

## 2018-04-11 NOTE — Assessment & Plan Note (Addendum)
Improved with Losartan, continues to have occasional elevations above goal at night (often after cigarette or beer).  He is to continue to monitor BP at home and continue to work on cessation of smoking and alcohol use.

## 2018-04-11 NOTE — Patient Instructions (Signed)
Fat and Cholesterol Restricted Diet Getting too much fat and cholesterol in your diet may cause health problems. Following this diet helps keep your fat and cholesterol at normal levels. This can keep you from getting sick. What types of fat should I choose?  Choose monosaturated and polyunsaturated fats. These are found in foods such as olive oil, canola oil, flaxseeds, walnuts, almonds, and seeds.  Eat more omega-3 fats. Good choices include salmon, mackerel, sardines, tuna, flaxseed oil, and ground flaxseeds.  Limit saturated fats. These are in animal products such as meats, butter, and cream. They can also be in plant products such as palm oil, palm kernel oil, and coconut oil.  Avoid foods with partially hydrogenated oils in them. These contain trans fats. Examples of foods that have trans fats are stick margarine, some tub margarines, cookies, crackers, and other baked goods. What general guidelines do I need to follow?  Check food labels. Look for the words "trans fat" and "saturated fat."  When preparing a meal: ? Fill half of your plate with vegetables and green salads. ? Fill one fourth of your plate with whole grains. Look for the word "whole" as the first word in the ingredient list. ? Fill one fourth of your plate with lean protein foods.  Eat more foods that have fiber, like apples, carrots, beans, peas, and barley.  Eat more home-cooked foods. Eat less at restaurants and buffets.  Limit or avoid alcohol.  Limit foods high in starch and sugar.  Limit fried foods.  Cook foods without frying them. Baking, boiling, grilling, and broiling are all great options.  Lose weight if you are overweight. Losing even a small amount of weight can help your overall health. It can also help prevent diseases such as diabetes and heart disease. What foods can I eat? Grains Whole grains, such as whole wheat or whole grain breads, crackers, cereals, and pasta. Unsweetened oatmeal,  bulgur, barley, quinoa, or brown rice. Corn or whole wheat flour tortillas. Vegetables Fresh or frozen vegetables (raw, steamed, roasted, or grilled). Green salads. Fruits All fresh, canned (in natural juice), or frozen fruits. Meat and Other Protein Products Ground beef (85% or leaner), grass-fed beef, or beef trimmed of fat. Skinless chicken or turkey. Ground chicken or turkey. Pork trimmed of fat. All fish and seafood. Eggs. Dried beans, peas, or lentils. Unsalted nuts or seeds. Unsalted canned or dry beans. Dairy Low-fat dairy products, such as skim or 1% milk, 2% or reduced-fat cheeses, low-fat ricotta or cottage cheese, or plain low-fat yogurt. Fats and Oils Tub margarines without trans fats. Light or reduced-fat mayonnaise and salad dressings. Avocado. Olive, canola, sesame, or safflower oils. Natural peanut or almond butter (choose ones without added sugar and oil). The items listed above may not be a complete list of recommended foods or beverages. Contact your dietitian for more options. What foods are not recommended? Grains White bread. White pasta. White rice. Cornbread. Bagels, pastries, and croissants. Crackers that contain trans fat. Vegetables White potatoes. Corn. Creamed or fried vegetables. Vegetables in a cheese sauce. Fruits Dried fruits. Canned fruit in light or heavy syrup. Fruit juice. Meat and Other Protein Products Fatty cuts of meat. Ribs, chicken wings, bacon, sausage, bologna, salami, chitterlings, fatback, hot dogs, bratwurst, and packaged luncheon meats. Liver and organ meats. Dairy Whole or 2% milk, cream, half-and-half, and cream cheese. Whole milk cheeses. Whole-fat or sweetened yogurt. Full-fat cheeses. Nondairy creamers and whipped toppings. Processed cheese, cheese spreads, or cheese curds. Sweets and Desserts Corn   syrup, sugars, honey, and molasses. Candy. Jam and jelly. Syrup. Sweetened cereals. Cookies, pies, cakes, donuts, muffins, and ice  cream. Fats and Oils Butter, stick margarine, lard, shortening, ghee, or bacon fat. Coconut, palm kernel, or palm oils. Beverages Alcohol. Sweetened drinks (such as sodas, lemonade, and fruit drinks or punches). The items listed above may not be a complete list of foods and beverages to avoid. Contact your dietitian for more information. This information is not intended to replace advice given to you by your health care provider. Make sure you discuss any questions you have with your health care provider. Document Released: 11/24/2011 Document Revised: 01/30/2016 Document Reviewed: 08/24/2013 Elsevier Interactive Patient Education  2018 [employer]. Cholesterol Cholesterol is a fat. Your body needs a small amount of cholesterol. Cholesterol (plaque) may build up in your blood vessels (arteries). That makes you more likely to have a heart attack or stroke. You cannot feel your cholesterol level. Having a blood test is the only way to find out if your level is high. Keep your test results. Work with your doctor to keep your cholesterol at a good level. What do the results mean?  Total cholesterol is how much cholesterol is in your blood.  LDL is bad cholesterol. This is the type that can build up. Try to have low LDL.  HDL is good cholesterol. It cleans your blood vessels and carries LDL away. Try to have high HDL.  Triglycerides are fat that the body can store or burn for energy. What are good levels of cholesterol?  Total cholesterol below 200.  LDL below 100 is good for people who have health risks. LDL below 70 is good for people who have very high risks.  HDL above 40 is good. It is best to have HDL of 60 or higher.  Triglycerides below 150. How can I lower my cholesterol? Diet Follow your diet program as told by your doctor.  Choose fish, white meat chicken, or [place] that is roasted or baked. Try not to eat red meat, fried foods, sausage, or lunch meats.  Eat lots of fresh  fruits and vegetables.  Choose whole grains, beans, pasta, potatoes, and cereals.  Choose olive oil, corn oil, or canola oil. Only use small amounts.  Try not to eat butter, mayonnaise, shortening, or palm kernel oils.  Try not to eat foods with trans fats.  Choose low-fat or nonfat dairy foods. ? Drink skim or nonfat milk. ? Eat low-fat or nonfat yogurt and cheeses. ? Try not to drink whole milk or cream. ? Try not to eat ice cream, egg yolks, or full-fat cheeses.  Healthy desserts include angel food cake, ginger snaps, animal crackers, hard candy, popsicles, and low-fat or nonfat frozen yogurt. Try not to eat pastries, cakes, pies, and cookies.  Exercise Follow your exercise program as told by your doctor.  Be more active. Try gardening, walking, and taking the stairs.  Ask your doctor about ways that you can be more active.  Medicine  Take over-the-counter and prescription medicines only as told by your doctor. This information is not intended to replace advice given to you by your health care provider. Make sure you discuss any questions you have with your health care provider. Document Released: 08/21/2008 Document Revised: 12/25/2015 Document Reviewed: 12/05/2015 Elsevier Interactive Patient Education  [employer].

## 2018-04-11 NOTE — Assessment & Plan Note (Signed)
Continue ASA and to collaborate with neurology.  Has appointment for angio 04/12/18.

## 2018-04-11 NOTE — Progress Notes (Signed)
BP 120/76   Pulse 75   Temp 98.3 F (36.8 C) (Oral)   Ht 5\' 9"  (1.753 m)   Wt 149 lb 3.2 oz (67.7 kg)   SpO2 98%   BMI 22.03 kg/m    Subjective:    Patient ID: Robert Jacobson, male    DOB: 12/29/1975, 42 y.o.   MRN: [id number]  HPI: Robert Jacobson is a 42 y.o. male presents for annual physical  Chief Complaint  Patient presents with  . Annual Exam   HYPERTENSION Continues on Losartan. Hypertension status: controlled  Satisfied with current treatment? yes Duration of hypertension: months BP monitoring frequency:  daily BP range: 108/76 to 152/115 (on review of BP appears to run higher in evening, this is also when he tends to have 4-5 beers and cigarettes) BP medication side effects:  no Medication compliance: excellent compliance Previous BP meds: none Aspirin: yes Recurrent headaches: no Visual changes: no Palpitations: no Dyspnea: no Chest pain: no Lower extremity edema: no Dizzy/lightheaded: no   HYPERLIPIDEMIA Continues on Atorvastatin. Hyperlipidemia status: excellent compliance Satisfied with current treatment?  no Side effects:  no Medication compliance: excellent compliance Past cholesterol meds: none Supplements: none Aspirin:  yes The ASCVD Risk score Denman George DC Jr., et al., 2013) failed to calculate for the following reasons:   The patient has a prior MI or stroke diagnosis Chest pain:  no Coronary artery disease:  no Family history CAD:  yes, grandparents Family history early CAD:  no   SUBARACHNOID HEMORRHAGE AND STROKE HISTORY: Seen by neurology on 04/05/18.  He is to continue ASA and Atorvastatin for stroke prevention.  On 04/12/18 is to have f/u angio with Dr. Corliss Skains.  He reports the numbness to side of right temple has improved and he is overall feeling better.  ALCOHOL, COCAINE, AND NICOTINE ABUSE: Currently at less pack a day cigarettes and 4-5 drinks a night, beer ([employer]).  No cocaine use since stroke per his  report.  At length discussion about tobacco cessation at this time he is not interested in medication at this time to assist with cessation.  He continues to wish to focus on cessation of both alcohol and tobacco, as he realizes it increases risk for cardiac events.  Has h/o DUI and rehab time for alcohol abuse.    Office Visit from 04/11/2018 in [facility]  Alcohol Use Disorder Identification Test Final Score (AUDIT)  12      Relevant past medical, surgical, family and social history reviewed and updated as indicated. Interim medical history since our last visit reviewed. Allergies and medications reviewed and updated.  Review of Systems  Constitutional: Negative for activity change, chills, fatigue and fever.  HENT: Negative for congestion, ear pain, rhinorrhea, sinus pressure, sinus pain and sore throat.   Eyes: Negative for pain, discharge, redness and visual disturbance.  Respiratory: Negative for cough, chest tightness, shortness of breath and wheezing.   Cardiovascular: Negative for chest pain, palpitations and leg swelling.  Gastrointestinal: Negative for abdominal distention, abdominal pain, constipation, diarrhea, nausea and vomiting.  Endocrine: Negative for cold intolerance, polydipsia, polyphagia and polyuria.  Genitourinary: Negative.   Musculoskeletal: Negative for back pain, myalgias and neck pain.  Skin: Negative.   Allergic/Immunologic: Negative.   Neurological: Negative for dizziness, syncope, facial asymmetry, speech difficulty, weakness, numbness and headaches.  Hematological: Negative.   Psychiatric/Behavioral: Negative for behavioral problems, confusion, self-injury, sleep disturbance and suicidal ideas. The patient is not nervous/anxious.    Per HPI unless  specifically indicated above     Objective:    BP 120/76   Pulse 75   Temp 98.3 F (36.8 C) (Oral)   Ht 5\' 9"  (1.753 m)   Wt 149 lb 3.2 oz (67.7 kg)   SpO2 98%   BMI 22.03 kg/m   Wt  Readings from Last 3 Encounters:  04/11/18 149 lb 3.2 oz (67.7 kg)  04/05/18 146 lb (66.2 kg)  04/01/18 147 lb 3.2 oz (66.8 kg)    Physical Exam  Constitutional: He is oriented to person, place, and time. He appears well-developed and well-nourished.  HENT:  Head: Normocephalic and atraumatic.  Right Ear: Hearing, tympanic membrane, external ear and ear canal normal. No drainage. No decreased hearing is noted.  Left Ear: Hearing, tympanic membrane, external ear and ear canal normal. No drainage. No decreased hearing is noted.  Nose: Nose normal. Right sinus exhibits no maxillary sinus tenderness and no frontal sinus tenderness. Left sinus exhibits no maxillary sinus tenderness and no frontal sinus tenderness.  Mouth/Throat: Uvula is midline, oropharynx is clear and moist and mucous membranes are normal.  Eyes: Pupils are equal, round, and reactive to light. Conjunctivae, EOM and lids are normal. Right eye exhibits no discharge. Left eye exhibits no discharge.  Neck: Trachea normal and normal range of motion. Neck supple. No JVD present. Carotid bruit is not present. No thyromegaly present.  Cardiovascular: Normal rate, regular rhythm, S1 normal, S2 normal, normal heart sounds and intact distal pulses. Exam reveals no gallop.  No murmur heard. Pulmonary/Chest: Effort normal and breath sounds normal.  Abdominal: Soft. Bowel sounds are normal. There is no splenomegaly or hepatomegaly.  Genitourinary:  Genitourinary Comments: Deferred per patient request.  Musculoskeletal: Normal range of motion.  Lymphadenopathy:    He has no cervical adenopathy.  Neurological: He is alert and oriented to person, place, and time. He has normal strength and normal reflexes. No cranial nerve deficit or sensory deficit. He displays a negative Romberg sign.  Reflex Scores:      Brachioradialis reflexes are 2+ on the right side and 2+ on the left side.      Patellar reflexes are 2+ on the right side and 2+ on the  left side. Skin: Skin is warm, dry and intact. Capillary refill takes less than 2 seconds. No rash noted.  Multiple tattoos noted.  Psychiatric: He has a normal mood and affect. His behavior is normal. Judgment and thought content normal.  Nursing note and vitals reviewed.   Results for orders placed or performed during the hospital encounter of 03/28/18  CBC  Result Value Ref Range   WBC 6.2 4.0 - 10.5 K/uL   RBC 4.70 4.22 - 5.81 MIL/uL   Hemoglobin 14.9 13.0 - 17.0 g/dL   HCT 16.1 09.6 - 04.5 %   MCV 93.6 80.0 - 100.0 fL   MCH 31.7 26.0 - 34.0 pg   MCHC 33.9 30.0 - 36.0 g/dL   RDW 40.9 81.1 - 91.4 %   Platelets 319 150 - 400 K/uL   nRBC 0.0 0.0 - 0.2 %  Basic metabolic panel  Result Value Ref Range   Sodium 140 135 - 145 mmol/L   Potassium 3.8 3.5 - 5.1 mmol/L   Chloride 101 98 - 111 mmol/L   CO2 27 22 - 32 mmol/L   Glucose, Bld 105 (H) 70 - 99 mg/dL   BUN 14 6 - 20 mg/dL   Creatinine, Ser 7.82 0.61 - 1.24 mg/dL   Calcium 9.1 8.9 -  10.3 mg/dL   GFR calc non Af Amer >60 >60 mL/min   GFR calc Af Amer >60 >60 mL/min   Anion gap 12 5 - 15  Protime-INR  Result Value Ref Range   Prothrombin Time 11.8 11.4 - 15.2 seconds   INR 0.87   APTT  Result Value Ref Range   aPTT 27 24 - 36 seconds      Assessment & Plan:   Problem List Items Addressed This Visit      Cardiovascular and Mediastinum   Stroke [facility])    Continue ASA and collaboration with neurology.  Has appointment for angio 04/12/18.  Goal BP <130/90 and LDL less than 70.      Relevant Medications   aspirin 325 MG EC tablet   Essential (primary) hypertension    Improved with Losartan, continues to have occasional elevations above goal at night (often after cigarette or beer).  He is to continue to monitor BP at home and continue to work on cessation of smoking and alcohol use.      Relevant Medications   aspirin 325 MG EC tablet   Other Relevant Orders   CBC     Nervous and Auditory   Subarachnoid  hemorrhage (HCC)    Continue ASA and to collaborate with neurology.  Has appointment for angio 04/12/18.        Other   Hyperlipidemia    Continues on Atorvastatin for stroke prevention.  Goal LDL <70.  Lipid panel today.      Relevant Medications   aspirin 325 MG EC tablet   Other Relevant Orders   Lipid Profile   Alcohol dependence (HCC)    Is working on cutting back, down to 4-5 beers at night on occasion.  Monitor closely and discuss rehab admission if worsening.  Encourage cessation of alcohol.      Cocaine abuse (HCC)    Continues to abstain from cocaine use.  Obtain drug screen intermittently to check, next visit.      Nicotine dependence, cigarettes, uncomplicated    Chronic, continue to encourage cessation.  Not interested in medication at this time to assist with cessation.       Other Visit Diagnoses    Annual physical exam    -  Primary   Relevant Orders   HIV Antibody (routine testing w rflx)   TSH      Time: >8 minutes spent during visit discussing tobacco cessation and methods that can be utilized  Follow up plan: Return in about 3 months (around 07/12/2018) for HTN and HLD.

## 2018-04-11 NOTE — Assessment & Plan Note (Addendum)
Continues on Atorvastatin for stroke prevention.  Goal LDL <70.  Lipid panel today.

## 2018-04-12 ENCOUNTER — Ambulatory Visit (HOSPITAL_COMMUNITY)
Admission: RE | Admit: 2018-04-12 | Discharge: 2018-04-12 | Disposition: A | Discharge status: Home / Self Care | Payer: 59 | Source: Ambulatory Visit | Attending: Interventional Radiology | Admitting: Interventional Radiology

## 2018-04-12 DIAGNOSIS — I771 Stricture of artery: Secondary | ICD-10-CM

## 2018-04-12 LAB — HIV ANTIBODY (ROUTINE TESTING W REFLEX): HIV SCREEN 4TH GENERATION: NONREACTIVE

## 2018-04-12 LAB — CBC
Hematocrit: 39.8 % (ref 37.5–51.0)
Hemoglobin: 13.9 g/dL (ref 13.0–17.7)
MCH: 32 pg (ref 26.6–33.0)
MCHC: 34.9 g/dL (ref 31.5–35.7)
MCV: 92 fL (ref 79–97)
PLATELETS: 301 10*3/uL (ref 150–450)
RBC: 4.35 x10E6/uL (ref 4.14–5.80)
RDW: 13.1 % (ref 12.3–15.4)
WBC: 5.1 10*3/uL (ref 3.4–10.8)

## 2018-04-12 LAB — LIPID PANEL
CHOLESTEROL TOTAL: 145 mg/dL (ref 100–199)
Chol/HDL Ratio: 2.3 ratio (ref 0.0–5.0)
HDL: 64 mg/dL (ref >39)
LDL CALC: 72 mg/dL (ref 0–99)
TRIGLYCERIDES: 47 mg/dL (ref 0–149)
VLDL Cholesterol Cal: 9 mg/dL (ref 5–40)

## 2018-04-12 LAB — TSH: TSH: 1.65 u[IU]/mL (ref 0.450–4.500)

## 2018-04-12 NOTE — Progress Notes (Signed)
Chief Complaint: Patient was seen in consultation today for R ICA occlusion with dissection  Referring Physician(s): Dr. Rory Percy  Supervising Physician: Luanne Bras  Patient Status: [facility] - Out-pt  History of Present Illness: Robert Jacobson is a 42 y.o. male with past medical history of HTN, tobacco use, and EtOH use who presented to [facility] ED 03/18/18 with acute stroke associated with right vision loss, syncope, and headache.  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  No intervention was required at the time of angiogram.  He was discharged home the following day after full neurologic recovery.  He presents to [facility] clinic today for follow-up.  Since discharge he was seen in the [facility] ED x1 for facial numbness.  CT Head at the time showed small, stable SAH.  He was taken off plavix and d/c'd with Neurology follow-up. He has been seen by Neurology who has placed him on aspirin alone.   Past Medical History:  Diagnosis Date  . Hypertension   . Stroke [facility])     Past Surgical History:  Procedure Laterality Date  . IR ANGIO INTRA EXTRACRAN SEL COM CAROTID INNOMINATE BILAT MOD SED  03/18/2018  . IR ANGIO VERTEBRAL SEL VERTEBRAL BILAT MOD SED  03/18/2018  . RADIOLOGY WITH ANESTHESIA N/A 03/18/2018   Procedure: IR WITH ANESTHESIA;  Surgeon: Radiologist, Medication, MD;  Location: [facility];  Service: Radiology;  Laterality: N/A;    Allergies: Patient has no known allergies.  Medications: Prior to Admission medications   Medication Sig Start Date End Date Taking? Authorizing Provider  aspirin 325 MG EC tablet Take 1 tablet (325 mg total) by mouth daily. 04/11/18   Cannady, Henrine Screws T, NP  atorvastatin (LIPITOR) 40 MG tablet Take 1 tablet (40 mg total) by mouth daily at 6 PM. 03/19/18   Rinehuls, Early Chars, PA-C    losartan (COZAAR) 50 MG tablet Take 0.5 tablets (25 mg total) by mouth daily. 03/23/18   Cannady, Henrine Screws T, NP  traZODone (DESYREL) 50 MG tablet Take 0.5 tablets (25 mg total) by mouth at bedtime as needed for sleep. 04/01/18   Venita Lick, NP     Family History  Problem Relation Age of Onset  . Hypertension Mother   . Hypertension Father   . Glaucoma Father   . Stroke Maternal Grandmother   . Cerebral aneurysm Maternal Grandfather     Social History   Socioeconomic History  . Marital status: Divorced    Spouse name: Not on file  . Number of children: 2  . Years of education: 5  . Highest education level: High school graduate  Occupational History  . Not on file  Social Needs  . Financial resource strain: Not on file  . Food insecurity:    Worry: Not on file    Inability: Not on file  . Transportation needs:    Medical: Not on file    Non-medical: Not on file  Tobacco Use  . Smoking status: Current Every Day Smoker    Packs/day: 1.00    Years: 25.00    Pack years: 25.00    Types: Cigarettes  . Smokeless tobacco: Never Used  . Tobacco comment: 04/05/18 <1 ppd  Substance and Sexual Activity  . Alcohol use: Yes    Alcohol/week: 35.0 standard drinks    Types: 35 Cans of beer per week  Comment: 5 beers per day; 04/05/18 now 3-4 beers per day, some days none.  . Drug use: Yes    Types: Cocaine, Marijuana    Comment: Hasn't used cocaine since before stroke. Still using Marijuana. 04/01/18  . Sexual activity: Yes  Lifestyle  . Physical activity:    Days per week: Not on file    Minutes per session: Not on file  . Stress: Not on file  Relationships  . Social connections:    Talks on phone: Not on file    Gets together: Not on file    Attends religious service: Not on file    Active member of club or organization: Not on file    Attends meetings of clubs or organizations: Not on file    Relationship status: Not on file  Other Topics Concern  . Not on  file  Social History Narrative   Lives at home alone   Caffeine use: 3-4 cups of coffee in the morning and 4 cups of coca cola daily     Review of Systems: A 12 point ROS discussed and pertinent positives are indicated in the HPI above.  All other systems are negative.  Review of Systems  Constitutional: Negative for fatigue and fever.  Respiratory: Negative for cough and shortness of breath.   Cardiovascular: Negative for chest pain.  Gastrointestinal: Negative for abdominal pain.  Musculoskeletal: Negative for back pain.  Neurological: Negative for dizziness, seizures, facial asymmetry, speech difficulty, weakness, light-headedness, numbness and headaches.  Psychiatric/Behavioral: Negative for behavioral problems and confusion.    Vital Signs: There were no vitals taken for this visit.  Physical Exam  Nursing note and vitals reviewed. NAD, alert, conversant Neuro:   Facial symmetry. Tongue midline.  Speech clear. Mild residual numbness over the right forehead. No gait abnormalities.       Imaging: Ct Angio Head W Or Wo Contrast  Result Date: 03/28/2018 CLINICAL DATA:  RIGHT-sided weakness and headache. Follow-up subarachnoid hemorrhage. 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 EXAM: CT ANGIOGRAPHY HEAD TECHNIQUE: Multidetector CT imaging of the head was performed using the standard protocol during bolus administration of intravenous contrast. Multiplanar CT image reconstructions and MIPs were obtained to evaluate the vascular anatomy. CONTRAST:  43m ISOVUE-370 IOPAMIDOL (ISOVUE-370) INJECTION 76% COMPARISON:  MRI head March 19, 2018 and CT angiogram head and neck March 18, 2018 FINDINGS: ANTERIOR CIRCULATION: Focal occluded RIGHT ICA cervical segment at skull base similar to prior CTA with patent though asymmetrically smaller RIGHT ICA. Mildly ectatic RIGHT cavernous ICA, unchanged. Fenestrated LEFT A2 origin unchanged. Patent  anterior and middle cerebral arteries. No large vessel occlusion, significant stenosis, contrast extravasation or aneurysm. POSTERIOR CIRCULATION: Patent vertebral arteries, vertebrobasilar junction and basilar artery, as well as main branch vessels. Patent posterior cerebral arteries. Small bilateral posterior communicating arteries present. No large vessel occlusion, significant stenosis, contrast extravasation or aneurysm. VENOUS SINUSES: Major dural venous sinuses are patent though not tailored for evaluation on this angiographic examination. ANATOMIC VARIANTS: None. DELAYED PHASE: No abnormal intracranial enhancement though confounded by RIGHT frontal subarachnoid hemorrhage. MIP images reviewed. IMPRESSION: 1. No emergent large vessel occlusion or acute intracranial process. No aneurysm. 2. Unchanged appearance of RIGHT cervical ICA dissection and focal occlusion with reconstitution, incompletely imaged. Electronically Signed   By: CElon AlasM.D.   On: 03/28/2018 20:23   Ct Angio Head W Or Wo Contrast  Result Date: 03/18/2018 CLINICAL DATA:  Headache, acute, severe, worst headache of life. Episode of right-sided  weakness and right visual loss lasting 3 minutes which has since resolved. EXAM: CT ANGIOGRAPHY HEAD AND NECK TECHNIQUE: Multidetector CT imaging of the head and neck was performed using the standard protocol during bolus administration of intravenous contrast. Multiplanar CT image reconstructions and MIPs were obtained to evaluate the vascular anatomy. Carotid stenosis measurements (when applicable) are obtained utilizing NASCET criteria, using the distal internal carotid diameter as the denominator. CONTRAST:  84m OMNIPAQUE IOHEXOL 350 MG/ML SOLN COMPARISON:  None. FINDINGS: CT HEAD FINDINGS Brain: No acute cortical infarct, hemorrhage, or mass lesion is present. The ventricles are of normal size. Basal ganglia are intact. Insular ribbon is normal. No significant white matter disease is  present. The brainstem and cerebellum are normal. Vascular: A hyperdense right MCA is present. No significant vascular calcifications are present. Skull: Calvarium is intact. No focal lytic or blastic lesions are present. Sinuses: The paranasal sinuses and mastoid air cells are clear. Orbits: Globes and orbits are within normal limits. Review of the MIP images confirms the above findings CTA NECK FINDINGS Aortic arch: A 3 vessel arch configuration is present. No significant vascular disease or stenosis is present. Right carotid system: The right common carotid artery is within normal limits. Bifurcation is unremarkable. The proximal right ICA is decreased in caliber compared to the left. There is focal irregularity just below the skull base with marked tapering compatible with focal dissection. The right ICA is tortuous. Left carotid system: The left common carotid artery is within normal limits. Bifurcation is unremarkable. The cervical left ICA is moderately tortuous without focal stenosis or vascular injury. Vertebral arteries: The vertebral arteries are codominant. Both vertebral arteries originate from the subclavian arteries without significant stenosis. There is no focal stenosis or injury to either vertebral artery in the neck. Skeleton: Vertebral body heights alignment are maintained. No focal lytic or blastic lesions are present. Mild endplate degenerative changes are present at C3-4. Other neck: The soft tissues of the neck are unremarkable. Upper chest: Focal early centrilobular emphysematous changes are suggested. Upper mediastinum and thoracic inlet are within normal limits. Review of the MIP images confirms the above findings CTA HEAD FINDINGS Anterior circulation: There is marked attenuation of the right internal carotid artery at the skull base and through the cavernous segment. Contrast is more robust in the right A1 segment. There is a partial filling defect in the distal right M1 segment through  the right MCA bifurcation. The right MCA branch vessels are intact. The partial filling defect extends into the anterior M2 branch. Left MCA branch vessels and bilateral ACA branch vessels are within normal limits. The left internal carotid artery, left A1 and M1 segments are normal. Posterior circulation: Vertebral arteries are codominant. PICA origins are visualized and normal. Basilar artery is normal. Both posterior cerebral arteries originate from the basilar tip. PCA branch vessels are normal. Venous sinuses: The dural sinuses are patent. The right transverse sinus is dominant. Straight sinus deep cerebral veins are intact. Cortical veins are within normal limits. Anatomic variants: None Delayed phase: Postcontrast images demonstrate no pathologic enhancement or infarct. Review of the MIP images confirms the above findings IMPRESSION: 1. 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 2. The right internal carotid artery is reconstituted at the skull base. This may represent retrograde flow from a more distal reconstitution. 3. Decreased intensity of contrast suggesting decreased perfusion within the right internal carotid artery at the skull base through the ICA terminus. 4. Partial filling defect of the distal right M1  segment extending into the anterior right M2 segment compatible with thrombus. 5. No emergent large vessel occlusion. 6. Left MCA and bilateral ACA distributions are unremarkable. 7. No acute infarct. These results were called by telephone at the time of interpretation on 03/18/2018 at 12:40 pm to Dr. Carrie Mew , who verbally acknowledged these results. Electronically Signed   By: San Morelle M.D.   On: 03/18/2018 12:49   Ct Head Wo Contrast  Result Date: 03/28/2018 CLINICAL DATA:  42 y/o M; right temporal paresthesia 2 nights ago. History of right-sided dissection 2 weeks ago. EXAM: CT HEAD WITHOUT CONTRAST TECHNIQUE: Contiguous axial images  were obtained from the base of the skull through the vertex without intravenous contrast. COMPARISON:  03/18/2018 CT head. 03/19/2018 MRI head. FINDINGS: Brain: No evidence of new acute infarction, hydrocephalus, extra-axial collection or mass lesion/mass effect. Very small hypodensities are present corresponding to infarcts on prior MRI of the brain. Tiny volume of subarachnoid hemorrhage is present over the right cerebral convexity (series 2, image 24, series 4, image 27, series 5, image 16). Vascular: No hyperdense vessel or unexpected calcification. Skull: Normal. Negative for fracture or focal lesion. Sinuses/Orbits: No acute finding. Other: None. IMPRESSION: New tiny volume of subarachnoid hemorrhage over right cerebral convexity from 03/18/2018. No additional acute abnormality identified. These results were called by telephone at the time of interpretation on 03/28/2018 at 2:04 pm to Dr. Kerman Passey, who verbally acknowledged these results. Electronically Signed   By: Kristine Garbe M.D.   On: 03/28/2018 14:08   Ct Angio Neck W And/or Wo Contrast  Result Date: 03/18/2018 CLINICAL DATA:  Headache, acute, severe, worst headache of life. Episode of right-sided weakness and right visual loss lasting 3 minutes which has since resolved. EXAM: CT ANGIOGRAPHY HEAD AND NECK TECHNIQUE: Multidetector CT imaging of the head and neck was performed using the standard protocol during bolus administration of intravenous contrast. Multiplanar CT image reconstructions and MIPs were obtained to evaluate the vascular anatomy. Carotid stenosis measurements (when applicable) are obtained utilizing NASCET criteria, using the distal internal carotid diameter as the denominator. CONTRAST:  28m OMNIPAQUE IOHEXOL 350 MG/ML SOLN COMPARISON:  None. FINDINGS: CT HEAD FINDINGS Brain: No acute cortical infarct, hemorrhage, or mass lesion is present. The ventricles are of normal size. Basal ganglia are intact. Insular ribbon  is normal. No significant white matter disease is present. The brainstem and cerebellum are normal. Vascular: A hyperdense right MCA is present. No significant vascular calcifications are present. Skull: Calvarium is intact. No focal lytic or blastic lesions are present. Sinuses: The paranasal sinuses and mastoid air cells are clear. Orbits: Globes and orbits are within normal limits. Review of the MIP images confirms the above findings CTA NECK FINDINGS Aortic arch: A 3 vessel arch configuration is present. No significant vascular disease or stenosis is present. Right carotid system: The right common carotid artery is within normal limits. Bifurcation is unremarkable. The proximal right ICA is decreased in caliber compared to the left. There is focal irregularity just below the skull base with marked tapering compatible with focal dissection. The right ICA is tortuous. Left carotid system: The left common carotid artery is within normal limits. Bifurcation is unremarkable. The cervical left ICA is moderately tortuous without focal stenosis or vascular injury. Vertebral arteries: The vertebral arteries are codominant. Both vertebral arteries originate from the subclavian arteries without significant stenosis. There is no focal stenosis or injury to either vertebral artery in the neck. Skeleton: Vertebral body heights alignment are maintained. No  focal lytic or blastic lesions are present. Mild endplate degenerative changes are present at C3-4. Other neck: The soft tissues of the neck are unremarkable. Upper chest: Focal early centrilobular emphysematous changes are suggested. Upper mediastinum and thoracic inlet are within normal limits. Review of the MIP images confirms the above findings CTA HEAD FINDINGS Anterior circulation: There is marked attenuation of the right internal carotid artery at the skull base and through the cavernous segment. Contrast is more robust in the right A1 segment. There is a partial  filling defect in the distal right M1 segment through the right MCA bifurcation. The right MCA branch vessels are intact. The partial filling defect extends into the anterior M2 branch. Left MCA branch vessels and bilateral ACA branch vessels are within normal limits. The left internal carotid artery, left A1 and M1 segments are normal. Posterior circulation: Vertebral arteries are codominant. PICA origins are visualized and normal. Basilar artery is normal. Both posterior cerebral arteries originate from the basilar tip. PCA branch vessels are normal. Venous sinuses: The dural sinuses are patent. The right transverse sinus is dominant. Straight sinus deep cerebral veins are intact. Cortical veins are within normal limits. Anatomic variants: None Delayed phase: Postcontrast images demonstrate no pathologic enhancement or infarct. Review of the MIP images confirms the above findings IMPRESSION: 1. 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 2. The right internal carotid artery is reconstituted at the skull base. This may represent retrograde flow from a more distal reconstitution. 3. Decreased intensity of contrast suggesting decreased perfusion within the right internal carotid artery at the skull base through the ICA terminus. 4. Partial filling defect of the distal right M1 segment extending into the anterior right M2 segment compatible with thrombus. 5. No emergent large vessel occlusion. 6. Left MCA and bilateral ACA distributions are unremarkable. 7. No acute infarct. These results were called by telephone at the time of interpretation on 03/18/2018 at 12:40 pm to Dr. Carrie Mew , who verbally acknowledged these results. Electronically Signed   By: San Morelle M.D.   On: 03/18/2018 12:49   Mr Brain Wo Contrast  Result Date: 03/19/2018 CLINICAL DATA:  Stroke follow-up. Illicit drug use. Urine drug screen positive for cocaine, benzodiazepines, and THC. RIGHT ICA  dissection. RIGHT vision loss. Syncope with headache. EXAM: MRI HEAD WITHOUT CONTRAST TECHNIQUE: Multiplanar, multiecho pulse sequences of the brain and surrounding structures were obtained without intravenous contrast. COMPARISON:  CTA head neck 03/18/2018. Cerebral angiogram 03/18/2018. FINDINGS: Brain: Subcentimeter focus of restricted diffusion, RIGHT insula/external capsule consistent with acute stroke. Additional subcentimeter focus of restricted diffusion RIGHT centrum semiovale. No large vessel cortical infarct. No hemorrhage, mass lesion, hydrocephalus, or extra-axial fluid. Slight premature for age atrophy. No significant white matter disease. Remote brain substance loss, RIGHT inferior frontal lobe likely closed head injury. Vascular: RIGHT ICA is thrombosed in its skull base and cavernous segments. Collateral flow reconstitutes the siphon and RIGHT M1 MCA. Skull and upper cervical spine: Unremarkable. Sinuses/Orbits: Mild sinus disease. Hypoplastic LEFT maxillary sinus. Other: None. IMPRESSION: Two small foci of nonhemorrhagic acute infarction within the RIGHT MCA territory, as described above. Thrombosed RIGHT ICA due to dissection. Intracranial anterior circulation is patent from collateral flow. Premature for age atrophy. Remote brain substance loss RIGHT frontal lobe, likely closed head injury. Electronically Signed   By: Staci Righter M.D.   On: 03/19/2018 15:32   Ir Angio Intra Extracran Sel Com Carotid Innominate Bilat Mod Sed  Result Date: 03/22/2018 CLINICAL DATA:  Right-sided amaurosis  fugax with transient weakness on the left side. Abnormal CT angiogram of the head and neck EXAM: IR ANGIO VERTEBRAL SEL VERTEBRAL BILAT MOD SED; BILATERAL COMMON CAROTID AND INNOMINATE ANGIOGRAPHY COMPARISON:  CT angiogram of the head and neck of 03/18/2018. MEDICATIONS: Heparin none units IV; no antibiotic was administered within 1 hour of the procedure. ANESTHESIA/SEDATION: Mac anesthesia as per Department  of Anesthesiology at [facility]:  Isovue 300 approximately 60 mL FLUOROSCOPY TIME:  Fluoroscopy Time: 6 minutes 36 seconds (939 mGy). COMPLICATIONS: None immediate. TECHNIQUE: Informed written consent was obtained from the patient after a thorough discussion of the procedural risks, benefits and alternatives. All questions were addressed. Maximal Sterile Barrier Technique was utilized including caps, mask, sterile gowns, sterile gloves, sterile drape, hand hygiene and skin antiseptic. A timeout was performed prior to the initiation of the procedure. The right groin was prepped and draped in the usual sterile fashion. Thereafter using modified Seldinger technique, transfemoral access into the right common femoral artery was obtained without difficulty. Over a 0.035 inch guidewire, a 5 French Pinnacle sheath was inserted. Through this, and also over 0.035 inch guidewire, a 5 [place] JB 1 catheter was advanced to the aortic arch region and selectively positioned in the right common carotid artery, the right vertebral artery, the left common carotid artery and the left vertebral artery. FINDINGS: The right vertebral artery origin is widely patent. The vessel is seen to opacify normally to the cranial skull base. Wide patency is seen of the right posterior-inferior cerebellar artery and the right vertebrobasilar junction. The opacified portion of the basilar artery, the posterior cerebral arteries, the superior cerebellar arteries and the anterior-inferior cerebellar arteries is normal into the capillary and venous phases. Unopacified blood is seen in the basilar artery from the contralateral vertebral artery. Prompt opacification via the right posterior communicating artery of the right internal carotid artery supraclinoid segment, and subsequently the right middle cerebral artery is noted. Unopacified blood is seen in the right middle cerebral artery distribution from the contralateral left ICA. The  right common carotid arteriogram demonstrates the right external carotid artery and its major branches to be widely patent. The right internal carotid artery at the bulb demonstrates wide patency. However, distal to this there is gradual tapering to the level of cervical petrous segment where there is a complete occlusion in the proximal petrous segment associated with a more proximal severe tapered stenosis with near complete occlusion of the cervical petrous junction. There is no distal reconstitution of the right internal carotid artery from the right external carotid artery branches. The left common carotid arteriogram demonstrates the left external carotid artery and its major branches to be widely patent. The left internal carotid artery at the bulb demonstrates wide patency. There is wide patency associated with a U-shaped configuration of the mid cervical left ICA without evidence of kinking. The petrous, cavernous and supraclinoid segments demonstrate wide patency. The left middle cerebral artery and the left anterior cerebral artery opacify into the capillary and venous phases. There is prompt opacification via the anterior communicating artery of the right anterior cerebral artery A2 A1 segments, and also the right middle cerebral artery distribution. Inflow of unopacified blood is seen in the right middle cerebral artery from the right internal carotid artery via the posterior circulation described below. The origin of left vertebral artery is normal. The vessel is seen to opacify normally to the cranial skull base. Wide patency is seen of the left posterior-inferior cerebellar artery and the left  vertebrobasilar junction. The basilar artery, the posterior cerebral arteries, the superior cerebellar arteries and the anterior-inferior cerebellar arteries demonstrate normal opacification. Unopacified blood is seen in the proximal basilar artery from the contralateral vertebral artery. Also demonstrated is  prompt opacification via the right posterior communicating artery of the right internal carotid artery supraclinoid segment and subsequently the right middle cerebral artery distribution. IMPRESSION: Angiographically complete occlusion of the right internal carotid artery in the proximal petrous segment associated with a severe high-grade pre occlusive tapered stenosis at the cervical petrous junction. Findings most consistent with dissection. Adequate compensation of the right anterior circulation from the anterior communicating artery, and also ipsilateral right posterior communicating artery as described above. PLAN: Patient return to ICU for further stroke management. Follow-up in clinic 4 weeks post discharge. Electronically Signed   By: Luanne Bras M.D.   On: 03/21/2018 08:15   Ir Angio Vertebral Sel Vertebral Bilat Mod Sed  Result Date: 03/22/2018 CLINICAL DATA:  Right-sided amaurosis fugax with transient weakness on the left side. Abnormal CT angiogram of the head and neck EXAM: IR ANGIO VERTEBRAL SEL VERTEBRAL BILAT MOD SED; BILATERAL COMMON CAROTID AND INNOMINATE ANGIOGRAPHY COMPARISON:  CT angiogram of the head and neck of 03/18/2018. MEDICATIONS: Heparin none units IV; no antibiotic was administered within 1 hour of the procedure. ANESTHESIA/SEDATION: Mac anesthesia as per Department of Anesthesiology at [place]:  Isovue 300 approximately 60 mL FLUOROSCOPY TIME:  Fluoroscopy Time: 6 minutes 36 seconds (939 mGy). COMPLICATIONS: None immediate. TECHNIQUE: Informed written consent was obtained from the patient after a thorough discussion of the procedural risks, benefits and alternatives. All questions were addressed. Maximal Sterile Barrier Technique was utilized including caps, mask, sterile gowns, sterile gloves, sterile drape, hand hygiene and skin antiseptic. A timeout was performed prior to the initiation of the procedure. The right groin was prepped and draped in the  usual sterile fashion. Thereafter using modified Seldinger technique, transfemoral access into the right common femoral artery was obtained without difficulty. Over a 0.035 inch guidewire, a 5 French Pinnacle sheath was inserted. Through this, and also over 0.035 inch guidewire, a 5 [place] JB 1 catheter was advanced to the aortic arch region and selectively positioned in the right common carotid artery, the right vertebral artery, the left common carotid artery and the left vertebral artery. FINDINGS: The right vertebral artery origin is widely patent. The vessel is seen to opacify normally to the cranial skull base. Wide patency is seen of the right posterior-inferior cerebellar artery and the right vertebrobasilar junction. The opacified portion of the basilar artery, the posterior cerebral arteries, the superior cerebellar arteries and the anterior-inferior cerebellar arteries is normal into the capillary and venous phases. Unopacified blood is seen in the basilar artery from the contralateral vertebral artery. Prompt opacification via the right posterior communicating artery of the right internal carotid artery supraclinoid segment, and subsequently the right middle cerebral artery is noted. Unopacified blood is seen in the right middle cerebral artery distribution from the contralateral left ICA. The right common carotid arteriogram demonstrates the right external carotid artery and its major branches to be widely patent. The right internal carotid artery at the bulb demonstrates wide patency. However, distal to this there is gradual tapering to the level of cervical petrous segment where there is a complete occlusion in the proximal petrous segment associated with a more proximal severe tapered stenosis with near complete occlusion of the cervical petrous junction. There is no distal reconstitution of the right internal carotid  artery from the right external carotid artery branches. The left common carotid  arteriogram demonstrates the left external carotid artery and its major branches to be widely patent. The left internal carotid artery at the bulb demonstrates wide patency. There is wide patency associated with a U-shaped configuration of the mid cervical left ICA without evidence of kinking. The petrous, cavernous and supraclinoid segments demonstrate wide patency. The left middle cerebral artery and the left anterior cerebral artery opacify into the capillary and venous phases. There is prompt opacification via the anterior communicating artery of the right anterior cerebral artery A2 A1 segments, and also the right middle cerebral artery distribution. Inflow of unopacified blood is seen in the right middle cerebral artery from the right internal carotid artery via the posterior circulation described below. The origin of left vertebral artery is normal. The vessel is seen to opacify normally to the cranial skull base. Wide patency is seen of the left posterior-inferior cerebellar artery and the left vertebrobasilar junction. The basilar artery, the posterior cerebral arteries, the superior cerebellar arteries and the anterior-inferior cerebellar arteries demonstrate normal opacification. Unopacified blood is seen in the proximal basilar artery from the contralateral vertebral artery. Also demonstrated is prompt opacification via the right posterior communicating artery of the right internal carotid artery supraclinoid segment and subsequently the right middle cerebral artery distribution. IMPRESSION: Angiographically complete occlusion of the right internal carotid artery in the proximal petrous segment associated with a severe high-grade pre occlusive tapered stenosis at the cervical petrous junction. Findings most consistent with dissection. Adequate compensation of the right anterior circulation from the anterior communicating artery, and also ipsilateral right posterior communicating artery as described  above. PLAN: Patient return to ICU for further stroke management. Follow-up in clinic 4 weeks post discharge. Electronically Signed   By: Luanne Bras M.D.   On: 03/21/2018 08:15    Labs:  CBC: Recent Labs    03/18/18 1147 03/28/18 1348 04/11/18 1426  WBC 5.5 6.2 5.1  HGB 15.8 14.9 13.9  HCT 45.8 44.0 39.8  PLT 280 319 301    COAGS: Recent Labs    03/28/18 1429  INR 0.87  APTT 27    BMP: Recent Labs    03/18/18 1147 03/28/18 1348  NA 139 140  K 3.9 3.8  CL 100 101  CO2 26 27  GLUCOSE 91 105*  BUN 9 14  CALCIUM 9.3 9.1  CREATININE 0.91 1.19  GFRNONAA >60 >60  GFRAA >60 >60    LIVER FUNCTION TESTS: No results for input(s): BILITOT, AST, ALT, ALKPHOS, PROT, ALBUMIN in the last 8760 hours.  TUMOR MARKERS: No results for input(s): AFPTM, CEA, CA199, CHROMGRNA in the last 8760 hours.  Assessment and Plan: Complete R ICA occlusion with dissection, adequate anterior collateral reperfusion Patient presents to [facility] clinic today to discuss the results of his angiogram performed 03/18/18 during hospital admission for acute stroke-like symptoms.  Since discharge on Plavix and aspirin he did develop a small SAH.  His Plavix was discontinued and he remains on aspirin, lipitor, and losartan. He reports he has been trying to improve lifestyle choices but continues to drink 3-4 beers daily and smoke 1/2PPD. Still using cannabis.  Patient is encouraged to continue to work towards abstinence from tobacco, alcohol, and recreational drugs.  Will plan for CTA Head and Neck at 6 weeks post stroke event (expected in mid December). He understands and agrees to care plan.  Schedulers will contact to arrange imaging.   Thank you for this  interesting consult.  I greatly enjoyed meeting FERRON ISHMAEL and look forward to participating in their care.  A copy of this report was sent to the requesting provider on this date.  Electronically Signed: Docia Barrier,  PA 04/12/2018, 11:14 AM   I spent a total of  30 Minutes   in face to face in clinical consultation, greater than 50% of which was counseling/coordinating care for R ICA occlusion with dissection.

## 2018-04-19 ENCOUNTER — Ambulatory Visit: Payer: 59 | Admitting: Adult Health

## 2018-04-20 ENCOUNTER — Other Ambulatory Visit: Payer: Self-pay | Admitting: Nurse Practitioner

## 2018-04-20 MED ORDER — ATORVASTATIN CALCIUM 40 MG PO TABS: 40.0000 mg | ORAL_TABLET | Freq: Every day | ORAL | 3 refills | Status: DC

## 2018-04-20 MED ORDER — ASPIRIN 325 MG PO TBEC: 325.0000 mg | DELAYED_RELEASE_TABLET | Freq: Every day | ORAL | 3 refills | Status: DC

## 2018-04-20 NOTE — Telephone Encounter (Signed)
Requested medication (s) are due for refill today -yes  Requested medication (s) are on the active medication list -yes  Future visit scheduled -yes  Last refill: atorvastatin 40 mg  03/19/18  Notes to clinic: Patient has changed pharmacy- call to pharmacy- they do have his aspirin on file for him. The atorvastatin is filled by outside provider and he would like that filled by PCP.  Requested Prescriptions  Pending Prescriptions Disp Refills   aspirin 325 MG EC tablet 90 tablet 3    Sig: Take 1 tablet (325 mg total) by mouth daily.     Analgesics:  NSAIDS - aspirin Passed - 04/20/2018 10:38 AM      Passed - Patient is not pregnant      Passed - Valid encounter within last 12 months    Recent Outpatient Visits          1 week ago Annual physical exam   [facility] [place], Jolene T, NP   2 weeks ago Essential (primary) hypertension   [facility] Cannady, [place] T, NP   4 weeks ago Essential hypertension   [facility] [place], [place] T, NP      Future Appointments            In 2 months Cannady, Dorie Rank, NP [facility]          atorvastatin (LIPITOR) 40 MG tablet 30 tablet 3    Sig: Take 1 tablet (40 mg total) by mouth daily at 6 PM.     Cardiovascular:  Antilipid - Statins Passed - 04/20/2018 10:38 AM      Passed - Total Cholesterol in normal range and within 360 days    Cholesterol, Total  Date Value Ref Range Status  04/11/2018 145 100 - 199 mg/dL Final         Passed - LDL in normal range and within 360 days    LDL Calculated  Date Value Ref Range Status  04/11/2018 72 0 - 99 mg/dL Final         Passed - HDL in normal range and within 360 days    HDL  Date Value Ref Range Status  04/11/2018 64 >39 mg/dL Final         Passed - Triglycerides in normal range and within 360 days    Triglycerides  Date Value Ref Range Status  04/11/2018 47 0 - 149 mg/dL Final         Passed - Patient is not  pregnant      Passed - Valid encounter within last 12 months    Recent Outpatient Visits          1 week ago Annual physical exam   [facility] [place], Dorie Rank, NP   2 weeks ago Essential (primary) hypertension   [facility] Cannady, Dorie Rank, NP   4 weeks ago Essential hypertension   [facility] [place], [place] T, NP      Future Appointments            In 2 months Cannady, [place] T, NP [facility]            Requested Prescriptions  Pending Prescriptions Disp Refills   aspirin 325 MG EC tablet 90 tablet 3    Sig: Take 1 tablet (325 mg total) by mouth daily.     Analgesics:  NSAIDS - aspirin Passed - 04/20/2018 10:38 AM      Passed -  Patient is not pregnant      Passed - Valid encounter within last 12 months    Recent Outpatient Visits          1 week ago Annual physical exam   [facility] [place], Corrie [place]ol[place] T, NP   2 weeks ago Essential (primary) hypertension   [facility] Cannady, Jolene T, NP   4 weeks ago Essential hypertension   [facility] [place], [place] T, NP      Future Appointments            In 2 months Cannady, Dorie RankJolene T, NP [facility]          atorvastatin (LIPITOR) 40 MG tablet 30 tablet 3    Sig: Take 1 tablet (40 mg total) by mouth daily at 6 PM.     Cardiovascular:  Antilipid - Statins Passed - 04/20/2018 10:38 AM      Passed - Total Cholesterol in normal range and within 360 days    Cholesterol, Total  Date Value Ref Range Status  04/11/2018 145 100 - 199 mg/dL Final         Passed - LDL in normal range and within 360 days    LDL Calculated  Date Value Ref Range Status  04/11/2018 72 0 - 99 mg/dL Final         Passed - HDL in normal range and within 360 days    HDL  Date Value Ref Range Status  04/11/2018 64 >39 mg/dL Final         Passed - Triglycerides in normal range and within 360 days    Triglycerides  Date  Value Ref Range Status  04/11/2018 47 0 - 149 mg/dL Final         Passed - Patient is not pregnant      Passed - Valid encounter within last 12 months    Recent Outpatient Visits          1 week ago Annual physical exam   [facility] Cannady, Dorie RankJolene T, NP   2 weeks ago Essential (primary) hypertension   [facility] Cannady, Corrie DandyJolene T, NP   4 weeks ago Essential hypertension   [facility] [place], Dorie RankJolene T, NP      Future Appointments            In 2 months Cannady, Dorie RankJolene T, [facility]

## 2018-04-20 NOTE — Telephone Encounter (Signed)
Refill requests approved. 

## 2018-04-20 NOTE — Telephone Encounter (Signed)
Copied from CRM [id number]. Topic: Quick Communication - Rx Refill/Question >> Apr 20, 2018 10:32 AM Lorrine KinMcGee, Robert Jacobson, NT wrote: **Patient calling and states that [facility] has [facility] to [facility]. He called to get them transferred and they told him it could take 3 days. Patient wanted to see if Jolene could send those prescriptions to the pharmacy. Advised that it could take 3 days to get those sent as well due to refill policy.**   Medication: atorvastatin (LIPITOR) 40 MG tablet  aspirin 325 MG EC tablet   Has the patient contacted their pharmacy? Yes.   (Agent: If no, request that the patient contact the pharmacy for the refill.) (Agent: If yes, when and what did the pharmacy advise?)  Preferred Pharmacy (with phone number or street name): [facility] - [place], [place] - [address]  Agent: Please be advised that RX refills may take up to 3 business days. We ask that you follow-up with your pharmacy.

## 2018-06-20 ENCOUNTER — Other Ambulatory Visit (HOSPITAL_COMMUNITY): Payer: Self-pay | Admitting: Interventional Radiology

## 2018-06-20 ENCOUNTER — Telehealth (HOSPITAL_COMMUNITY): Payer: Self-pay

## 2018-06-20 DIAGNOSIS — I771 Stricture of artery: Secondary | ICD-10-CM

## 2018-06-20 NOTE — Telephone Encounter (Signed)
Called to schedule cta, no answer, no vm. AW 

## 2018-07-06 ENCOUNTER — Ambulatory Visit: Payer: 59 | Admitting: Adult Health

## 2018-07-15 ENCOUNTER — Ambulatory Visit: Payer: 59 | Admitting: Nurse Practitioner

## 2018-08-01 ENCOUNTER — Encounter: Payer: Self-pay | Admitting: Nurse Practitioner

## 2018-08-01 ENCOUNTER — Other Ambulatory Visit: Payer: Self-pay

## 2018-08-01 ENCOUNTER — Ambulatory Visit (INDEPENDENT_AMBULATORY_CARE_PROVIDER_SITE_OTHER): Payer: 59 | Admitting: Nurse Practitioner

## 2018-08-01 VITALS — BP 123/85 | HR 84 | Temp 98.3°F | Ht 69.0 in | Wt 155.0 lb

## 2018-08-01 DIAGNOSIS — F5101 Primary insomnia: Secondary | ICD-10-CM | POA: Diagnosis not present

## 2018-08-01 DIAGNOSIS — F1020 Alcohol dependence, uncomplicated: Secondary | ICD-10-CM | POA: Diagnosis not present

## 2018-08-01 DIAGNOSIS — I10 Essential (primary) hypertension: Secondary | ICD-10-CM

## 2018-08-01 DIAGNOSIS — E782 Mixed hyperlipidemia: Secondary | ICD-10-CM | POA: Diagnosis not present

## 2018-08-01 DIAGNOSIS — F17210 Nicotine dependence, cigarettes, uncomplicated: Secondary | ICD-10-CM

## 2018-08-01 MED ORDER — TRAZODONE HCL 50 MG PO TABS: 50.0000 mg | ORAL_TABLET | Freq: Every day | ORAL | 3 refills | Status: DC

## 2018-08-01 NOTE — Assessment & Plan Note (Signed)
Continues to work on cutting back- down to 18 beers/week. Recommended cessation of alcohol. Monitor closely and discuss rehab if worsening.

## 2018-08-01 NOTE — Assessment & Plan Note (Addendum)
Continues on atorvastatin for stroke prevention. Follow-up in 3 months for lipid panel.

## 2018-08-01 NOTE — Patient Instructions (Signed)
Coping with Quitting Smoking  Quitting smoking is a physical and mental challenge. You will face cravings, withdrawal symptoms, and temptation. Before quitting, work with your health care provider to make a plan that can help you cope. Preparation can help you quit and keep you from giving in. How can I cope with cravings? Cravings usually last for 5-10 minutes. If you get through it, the craving will pass. Consider taking the following actions to help you cope with cravings:  Keep your mouth busy: ? Chew sugar-free gum. ? Suck on hard candies or a straw. ? Brush your teeth.  Keep your hands and body busy: ? Immediately change to a different activity when you feel a craving. ? Squeeze or play with a ball. ? Do an activity or a hobby, like making bead jewelry, practicing needlepoint, or working with wood. ? Mix up your normal routine. ? Take a short exercise break. Go for a quick walk or run up and down stairs. ? Spend time in public places where smoking is not allowed.  Focus on doing something kind or helpful for someone else.  Call a friend or family member to talk during a craving.  Join a support group.  Call a quit line, such as 1-800-QUIT-NOW.  Talk with your health care provider about medicines that might help you cope with cravings and make quitting easier for you. How can I deal with withdrawal symptoms? Your body may experience negative effects as it tries to get used to not having nicotine in the system. These effects are called withdrawal symptoms. They may include:  Feeling hungrier than normal.  Trouble concentrating.  Irritability.  Trouble sleeping.  Feeling depressed.  Restlessness and agitation.  Craving a cigarette. To manage withdrawal symptoms:  Avoid places, people, and activities that trigger your cravings.  Remember why you want to quit.  Get plenty of sleep.  Avoid coffee and other caffeinated drinks. These may worsen some of your symptoms.  How can I handle social situations? Social situations can be difficult when you are quitting smoking, especially in the first few weeks. To manage this, you can:  Avoid parties, bars, and other social situations where people might be smoking.  Avoid alcohol.  Leave right away if you have the urge to smoke.  Explain to your family and friends that you are quitting smoking. Ask for understanding and support.  Plan activities with friends or family where smoking is not an option. What are some ways I can cope with stress? Wanting to smoke may cause stress, and stress can make you want to smoke. Find ways to manage your stress. Relaxation techniques can help. For example:  Breathe slowly and deeply, in through your nose and out through your mouth.  Listen to soothing, relaxing music.  Talk with a family member or friend about your stress.  Light a candle.  Soak in a bath or take a shower.  Think about a peaceful place. What are some ways I can prevent weight gain? Be aware that many people gain weight after they quit smoking. However, not everyone does. To keep from gaining weight, have a plan in place before you quit and stick to the plan after you quit. Your plan should include:  Having healthy snacks. When you have a craving, it may help to: ? Eat plain popcorn, crunchy carrots, celery, or other cut vegetables. ? Chew sugar-free gum.  Changing how you eat: ? Eat small portion sizes at meals. ? Eat 4-6 small meals   throughout the day instead of 1-2 large meals a day. ? Be mindful when you eat. Do not watch television or do other things that might distract you as you eat.  Exercising regularly: ? Make time to exercise each day. If you do not have time for a long workout, do short bouts of exercise for 5-10 minutes several times a day. ? Do some form of strengthening exercise, like weight lifting, and some form of aerobic exercise, like running or swimming.  Drinking plenty of  water or other low-calorie or no-calorie drinks. Drink 6-8 glasses of water daily, or as much as instructed by your health care provider. Summary  Quitting smoking is a physical and mental challenge. You will face cravings, withdrawal symptoms, and temptation to smoke again. Preparation can help you as you go through these challenges.  You can cope with cravings by keeping your mouth busy (such as by chewing gum), keeping your body and hands busy, and making calls to family, friends, or a helpline for people who want to quit smoking.  You can cope with withdrawal symptoms by avoiding places where people smoke, avoiding drinks with caffeine, and getting plenty of rest.  Ask your health care provider about the different ways to prevent weight gain, avoid stress, and handle social situations. This information is not intended to replace advice given to you by your health care provider. Make sure you discuss any questions you have with your health care provider. Document Released: 05/22/2016 Document Revised: 05/22/2016 Document Reviewed: 05/22/2016 Elsevier Interactive Patient Education  2019 [employer].  

## 2018-08-01 NOTE — Assessment & Plan Note (Signed)
Chronic. Recommended complete smoking cessation for increased stroke risk reduction. No interest in medication at this time.

## 2018-08-01 NOTE — Assessment & Plan Note (Addendum)
Trazodone dose increased. Pt is aware not to use alcohol with this medication. Follow-up in 3 months or sooner if needed.

## 2018-08-01 NOTE — Progress Notes (Signed)
BP 123/85   Pulse 84   Temp 98.3 F (36.8 C) (Oral)   Ht 5\' 9"  (1.753 m)   Wt 155 lb (70.3 kg)   SpO2 98%   BMI 22.89 kg/m    Subjective:    Patient ID: Robert Jacobson, male    DOB: Dec 06, 1975, 43 y.o.   MRN: [id number]  HPI: Robert Jacobson is a 43 y.o. male  Chief Complaint  Patient presents with  . Hypertension    f/u  . Hyperlipidemia   HYPERTENSION / HYPERLIPIDEMIA Pt currently taking losartan for HTN and atorvastatin for HLD. Pt reports reduction in smoking and alcohol consumption. He reports he missed his approval window for follow-up CT with neurology and is currently waiting on insurance to approve a new appointment.   Satisfied with current treatment? yes Duration of hypertension: months BP monitoring frequency: weekly BP range: 120's/80's BP medication side effects: no Duration of hyperlipidemia: months Cholesterol medication side effects: no Aspirin: yes Recent stressors: yes Recurrent headaches: no Visual changes: no Palpitations: no Dyspnea: no Chest pain: no Lower extremity edema: no Dizzy/lightheaded: no  ALCOHOL DEPENDENCE Pt reports he has reduced alcohol intake from a 12 pack of beer per day to appx 18 beers per week. He denies withdrawal symptoms at this time. Discussed the risks associated with continued alcohol use- pt acknowledges understanding.   NICOTINE DEPENDENCE Pt reports he has reduced smoking to "less than a pack per day". Discussed the risks associated with continued smoking including recurrent stroke- pt acknowledges understanding.     ANXIETY/INSOMNIA Pt reports Trazodone worked well for about a week, but is no longer working. He reports increased anxiety at night. He is falling asleep but waking up within a couple of hours and unable to fall back asleep.Pt states his father gave him "a few" of his "little peach xanax" and "those worked well".  Discussed with pt the risks associated with benzodiazepine use to include  increased sedation, respiratory suppression, falls,  dependence and cardiovascular events. Recommended pt avoid medications that have not been prescribed to him. Discussed SSRI use for anxiety, but patient declined stating "I have heard the risks with those". Duration:exacerbated Anxious mood: yes  Excessive worrying: no Irritability: no  Sweating: no Nausea: yes Palpitations:no Hyperventilation: no Panic attacks: no Agoraphobia: no  Obscessions/compulsions: no Depressed mood: no Recent Stressors/Life Changes: yes   Relationship problems: no   Family stress: no     Financial stress: no    Job stress: no    Recent death/loss: no GAD 7 : Generalized Anxiety Score 08/01/2018 03/23/2018  Nervous, Anxious, on Edge 1 1  Control/stop worrying 1 1  Worry too much - different things 1 1  Trouble relaxing 2 1  Restless 3 1  Easily annoyed or irritable 1 1  Afraid - awful might happen 0 1  Total GAD 7 Score 9 7  Anxiety Difficulty Not difficult at all -    Relevant past medical, surgical, family and social history reviewed and updated as indicated. Interim medical history since our last visit reviewed. Allergies and medications reviewed and updated.  Review of Systems  Constitutional: Negative for chills and diaphoresis.  Gastrointestinal: Negative for nausea.  Neurological: Negative for seizures.  Psychiatric/Behavioral: Negative for agitation and hallucinations.   Per HPI unless specifically indicated above     Objective:    BP 123/85   Pulse 84   Temp 98.3 F (36.8 C) (Oral)   Ht 5\' 9"  (1.753 m)   Wt  155 lb (70.3 kg)   SpO2 98%   BMI 22.89 kg/m   Wt Readings from Last 3 Encounters:  08/01/18 155 lb (70.3 kg)  04/11/18 149 lb 3.2 oz (67.7 kg)  04/05/18 146 lb (66.2 kg)    Physical Exam Vitals signs and nursing note reviewed.  Constitutional:      Appearance: He is well-developed.  HENT:     Head: Normocephalic and atraumatic.     Right Ear: Hearing normal. No  drainage.     Left Ear: Hearing normal. No drainage.     Mouth/Throat:     Pharynx: Uvula midline.  Eyes:     General: Lids are normal.        Right eye: No discharge.        Left eye: No discharge.     Conjunctiva/sclera: Conjunctivae normal.     Pupils: Pupils are equal, round, and reactive to light.  Neck:     Musculoskeletal: Normal range of motion and neck supple.     Thyroid: No thyromegaly.     Vascular: No carotid bruit or JVD.     Trachea: Trachea normal.  Cardiovascular:     Rate and Rhythm: Normal rate and regular rhythm.     Heart sounds: Normal heart sounds, S1 normal and S2 normal. No murmur. No gallop.   Pulmonary:     Effort: Pulmonary effort is normal.     Breath sounds: Normal breath sounds.  Abdominal:     General: Bowel sounds are normal.     Palpations: Abdomen is soft. There is no hepatomegaly or splenomegaly.  Musculoskeletal: Normal range of motion.     Right lower leg: No edema.     Left lower leg: No edema.  Skin:    General: Skin is warm and dry.     Capillary Refill: Capillary refill takes less than 2 seconds.     Findings: No rash.  Neurological:     Mental Status: He is alert and oriented to person, place, and time.     Deep Tendon Reflexes: Reflexes are normal and symmetric.  Psychiatric:        Mood and Affect: Mood is anxious.        Behavior: Behavior normal.        Thought Content: Thought content normal.        Judgment: Judgment normal.     Results for orders placed or performed in visit on 04/11/18  Lipid Profile  Result Value Ref Range   Cholesterol, Total 145 100 - 199 mg/dL   Triglycerides 47 0 - 149 mg/dL   HDL 64 >16 mg/dL   VLDL Cholesterol Cal 9 5 - 40 mg/dL   LDL Calculated 72 0 - 99 mg/dL   Chol/HDL Ratio 2.3 0.0 - 5.0 ratio  CBC  Result Value Ref Range   WBC 5.1 3.4 - 10.8 x10E3/uL   RBC 4.35 4.14 - 5.80 x10E6/uL   Hemoglobin 13.9 13.0 - 17.7 g/dL   Hematocrit 10.9 60.4 - 51.0 %   MCV 92 79 - 97 fL   MCH 32.0  26.6 - 33.0 pg   MCHC 34.9 31.5 - 35.7 g/dL   RDW 54.0 98.1 - 19.1 %   Platelets 301 150 - 450 x10E3/uL  HIV Antibody (routine testing w rflx)  Result Value Ref Range   HIV Screen 4th Generation wRfx Non Reactive Non Reactive  TSH  Result Value Ref Range   TSH 1.650 0.450 - 4.500 uIU/mL  Assessment & Plan:   Problem List Items Addressed This Visit      Cardiovascular and Mediastinum   Essential (primary) hypertension    Chronic, stable. CMP today. Continues losartan. Continue to work on smoking and alcohol cessation. Follow-up in 3 months.      Relevant Orders   Basic Metabolic Panel (BMET)     Other   Hyperlipidemia - Primary    Continues on atorvastatin for stroke prevention. Follow-up in 3 months for lipid panel.       Alcohol dependence (HCC)    Continues to work on cutting back- down to 18 beers/week. Recommended cessation of alcohol. Monitor closely and discuss rehab if worsening.       Nicotine dependence, cigarettes, uncomplicated    Chronic. Recommended complete smoking cessation for increased stroke risk reduction. No interest in medication at this time.       Insomnia    Trazodone dose increased. Pt is aware not to use alcohol with this medication. Follow-up in 3 months or sooner if needed.           Follow up plan: Return in about 3 months (around 10/30/2018) for HTN/HLD & Tobacco.   NOTE WRITTEN BY [employer] DNP STUDENT.  ASSESSMENT AND PLAN OF CARE REVIEWED WITH STUDENT, AGREE WITH ABOVE FINDINGS AND PLAN.

## 2018-08-01 NOTE — Assessment & Plan Note (Addendum)
Chronic, stable. BMET today. Continues losartan. Continue to work on smoking and alcohol cessation. Follow-up in 3 months.

## 2018-08-02 LAB — BASIC METABOLIC PANEL
BUN / CREAT RATIO: 22 — AB (ref 9–20)
BUN: 20 mg/dL (ref 6–24)
CO2: 23 mmol/L (ref 20–29)
CREATININE: 0.91 mg/dL (ref 0.76–1.27)
Calcium: 9.4 mg/dL (ref 8.7–10.2)
Chloride: 103 mmol/L (ref 96–106)
GFR, EST AFRICAN AMERICAN: 121 mL/min/{1.73_m2} (ref >59)
GFR, EST NON AFRICAN AMERICAN: 104 mL/min/{1.73_m2} (ref >59)
Glucose: 77 mg/dL (ref 65–99)
POTASSIUM: 4.5 mmol/L (ref 3.5–5.2)
SODIUM: 143 mmol/L (ref 134–144)

## 2018-08-03 ENCOUNTER — Telehealth (HOSPITAL_COMMUNITY): Payer: Self-pay

## 2018-08-03 NOTE — Telephone Encounter (Signed)
Called to schedule cta, no answer, no vm. AW 

## 2018-08-24 ENCOUNTER — Ambulatory Visit
Admission: RE | Admit: 2018-08-24 | Discharge: 2018-08-24 | Disposition: A | Discharge status: Home / Self Care | Payer: 59 | Source: Ambulatory Visit | Attending: Interventional Radiology | Admitting: Interventional Radiology

## 2018-08-24 ENCOUNTER — Ambulatory Visit: Payer: 59

## 2018-08-24 ENCOUNTER — Other Ambulatory Visit: Payer: Self-pay

## 2018-08-24 DIAGNOSIS — I771 Stricture of artery: Secondary | ICD-10-CM | POA: Diagnosis present

## 2018-08-24 MED ORDER — IOHEXOL 350 MG/ML SOLN
75.0000 mL | Freq: Once | INTRAVENOUS | Status: AC | PRN
  Administered 2018-08-24: 75 mL via INTRAVENOUS

## 2018-08-25 ENCOUNTER — Telehealth (HOSPITAL_COMMUNITY): Payer: Self-pay

## 2018-08-25 ENCOUNTER — Other Ambulatory Visit: Payer: Self-pay | Admitting: Nurse Practitioner

## 2018-08-25 NOTE — Telephone Encounter (Signed)
Pt agreed to f/u in 6 months with cta head/neck. AW 

## 2018-11-01 ENCOUNTER — Ambulatory Visit: Payer: 59 | Admitting: Nurse Practitioner

## 2019-01-16 ENCOUNTER — Other Ambulatory Visit: Payer: Self-pay | Admitting: Nurse Practitioner

## 2019-01-16 NOTE — Telephone Encounter (Signed)
Requested Prescriptions  Pending Prescriptions Disp Refills  . atorvastatin (LIPITOR) 40 MG tablet [Pharmacy Med Name: ATORVASTATIN 40 MG TABLET] 30 tablet 0    Sig: TAKE ONE TABLET BY MOUTH DAILY AT 6PM     Cardiovascular:  Antilipid - Statins Passed - 01/16/2019  2:40 PM      Passed - Total Cholesterol in normal range and within 360 days    Cholesterol, Total  Date Value Ref Range Status  04/11/2018 145 100 - 199 mg/dL Final         Passed - LDL in normal range and within 360 days    LDL Calculated  Date Value Ref Range Status  04/11/2018 72 0 - 99 mg/dL Final         Passed - HDL in normal range and within 360 days    HDL  Date Value Ref Range Status  04/11/2018 64 >39 mg/dL Final         Passed - Triglycerides in normal range and within 360 days    Triglycerides  Date Value Ref Range Status  04/11/2018 47 0 - 149 mg/dL Final         Passed - Patient is not pregnant      Passed - Valid encounter within last 12 months    Recent Outpatient Visits          5 months ago Mixed hyperlipidemia   [place] Cannady, Barbaraann Faster, NP   9 months ago Annual physical exam   [employer], Henrine Screws T, NP   9 months ago Essential (primary) hypertension   [place], [place] T, NP   9 months ago Essential hypertension   [place], Barbaraann Faster, NP

## 2019-03-30 ENCOUNTER — Telehealth: Payer: Self-pay

## 2019-03-30 ENCOUNTER — Other Ambulatory Visit: Payer: Self-pay

## 2019-03-30 ENCOUNTER — Emergency Department
Admission: EM | Admit: 2019-03-30 | Discharge: 2019-03-30 | Disposition: A | Discharge status: Home / Self Care | Payer: Self-pay | Attending: Emergency Medicine | Admitting: Emergency Medicine

## 2019-03-30 ENCOUNTER — Encounter: Payer: Self-pay | Admitting: Emergency Medicine

## 2019-03-30 ENCOUNTER — Emergency Department: Payer: Self-pay

## 2019-03-30 DIAGNOSIS — Z7982 Long term (current) use of aspirin: Secondary | ICD-10-CM | POA: Insufficient documentation

## 2019-03-30 DIAGNOSIS — I10 Essential (primary) hypertension: Secondary | ICD-10-CM | POA: Insufficient documentation

## 2019-03-30 DIAGNOSIS — F1410 Cocaine abuse, uncomplicated: Secondary | ICD-10-CM | POA: Insufficient documentation

## 2019-03-30 DIAGNOSIS — F17210 Nicotine dependence, cigarettes, uncomplicated: Secondary | ICD-10-CM | POA: Insufficient documentation

## 2019-03-30 DIAGNOSIS — I639 Cerebral infarction, unspecified: Secondary | ICD-10-CM | POA: Insufficient documentation

## 2019-03-30 DIAGNOSIS — Z79899 Other long term (current) drug therapy: Secondary | ICD-10-CM | POA: Insufficient documentation

## 2019-03-30 LAB — COMPREHENSIVE METABOLIC PANEL
ALT: 25 U/L (ref 0–44)
AST: 28 U/L (ref 15–41)
Albumin: 4.9 g/dL (ref 3.5–5.0)
Alkaline Phosphatase: 46 U/L (ref 38–126)
Anion gap: 12 (ref 5–15)
BUN: 8 mg/dL (ref 6–20)
CO2: 23 mmol/L (ref 22–32)
Calcium: 9.4 mg/dL (ref 8.9–10.3)
Chloride: 106 mmol/L (ref 98–111)
Creatinine, Ser: 0.77 mg/dL (ref 0.61–1.24)
GFR calc Af Amer: >60 mL/min (ref >60)
GFR calc non Af Amer: >60 mL/min (ref >60)
Glucose, Bld: 92 mg/dL (ref 70–99)
Potassium: 4 mmol/L (ref 3.5–5.1)
Sodium: 141 mmol/L (ref 135–145)
Total Bilirubin: 0.7 mg/dL (ref 0.3–1.2)
Total Protein: 8.6 g/dL — ABNORMAL HIGH (ref 6.5–8.1)

## 2019-03-30 LAB — PROTIME-INR
INR: 0.9 (ref 0.8–1.2)
Prothrombin Time: 12.2 seconds (ref 11.4–15.2)

## 2019-03-30 LAB — CBC
HCT: 43.1 % (ref 39.0–52.0)
Hemoglobin: 15 g/dL (ref 13.0–17.0)
MCH: 32.1 pg (ref 26.0–34.0)
MCHC: 34.8 g/dL (ref 30.0–36.0)
MCV: 92.1 fL (ref 80.0–100.0)
Platelets: 292 10*3/uL (ref 150–400)
RBC: 4.68 MIL/uL (ref 4.22–5.81)
RDW: 13.3 % (ref 11.5–15.5)
WBC: 6.9 10*3/uL (ref 4.0–10.5)
nRBC: 0 % (ref 0.0–0.2)

## 2019-03-30 LAB — DIFFERENTIAL
Abs Immature Granulocytes: 0.02 10*3/uL (ref 0.00–0.07)
Basophils Absolute: 0.1 10*3/uL (ref 0.0–0.1)
Basophils Relative: 1 %
Eosinophils Absolute: 0.4 10*3/uL (ref 0.0–0.5)
Eosinophils Relative: 5 %
Immature Granulocytes: 0 %
Lymphocytes Relative: 35 %
Lymphs Abs: 2.4 10*3/uL (ref 0.7–4.0)
Monocytes Absolute: 0.5 10*3/uL (ref 0.1–1.0)
Monocytes Relative: 7 %
Neutro Abs: 3.6 10*3/uL (ref 1.7–7.7)
Neutrophils Relative %: 52 %

## 2019-03-30 LAB — URINE DRUG SCREEN, QUALITATIVE (ARMC ONLY)
Amphetamines, Ur Screen: NOT DETECTED
Barbiturates, Ur Screen: NOT DETECTED
Benzodiazepine, Ur Scrn: NOT DETECTED
Cannabinoid 50 Ng, Ur ~~LOC~~: NOT DETECTED
Cocaine Metabolite,Ur ~~LOC~~: POSITIVE — AB
MDMA (Ecstasy)Ur Screen: NOT DETECTED
Methadone Scn, Ur: NOT DETECTED
Opiate, Ur Screen: NOT DETECTED
Phencyclidine (PCP) Ur S: NOT DETECTED
Tricyclic, Ur Screen: NOT DETECTED

## 2019-03-30 LAB — GLUCOSE, CAPILLARY: Glucose-Capillary: 92 mg/dL (ref 70–99)

## 2019-03-30 LAB — APTT: aPTT: 27 seconds (ref 24–36)

## 2019-03-30 MED ORDER — SODIUM CHLORIDE 0.9% FLUSH: 3.0000 mL | Freq: Once | INTRAVENOUS | Status: DC

## 2019-03-30 MED ORDER — IOHEXOL 350 MG/ML SOLN
100.0000 mL | Freq: Once | INTRAVENOUS | Status: AC | PRN
  Administered 2019-03-30: 02:00:00 100 mL via INTRAVENOUS

## 2019-03-30 MED ORDER — ASPIRIN 81 MG PO CHEW: 324.0000 mg | CHEWABLE_TABLET | Freq: Once | ORAL | Status: DC

## 2019-03-30 NOTE — ED Notes (Signed)
Pt states he is tired of waiting around, he has to be at work in the morning and wants to leave. EDP to bedside, explaining risks of leaving prior to CT resulting. Pt confirms risk and still wants to leave. Pt signed AMA at this time.

## 2019-03-30 NOTE — ED Notes (Signed)
Patient transported to CT 

## 2019-03-30 NOTE — Telephone Encounter (Signed)
Called and spoke with patient. Patient stated that he has no insurance at this time. He stated he will call back to schedule ER f/u when he is financially ready.

## 2019-03-30 NOTE — ED Triage Notes (Signed)
Pt with right sided numbness to the face as well as HA and blurred vision. Pt unable to smile or raise eyebrow on right side of face. Pt negative on arm and leg drift. Last know well was approx. 0000. Pt reports having stroke 6 months ago with similar symptoms. Pt taken off blood thinners recently due to hemorrhaging. Alfred Levins, MD called due to stroke like symptoms and need for CT. Per MD, pt taken to CT and then to room 10.

## 2019-03-30 NOTE — Discharge Instructions (Addendum)
As explained to you, we recommended you be admitted to the hospital for further evaluation for your symptoms concerning for stroke.  Unfortunately you decided to leave the hospital even before your CT scan results are back.  We are unable to determine if you had another dissection like last year.  This can lead to a massive stroke which can cause you to go into a coma or even die from this.  Please return at any time if you would like to continue your treatment.  Otherwise follow-up with your doctor in 24 hours.

## 2019-03-30 NOTE — Telephone Encounter (Signed)
-----   Message from Venita Lick, NP sent at 03/30/2019  5:58 AM EDT ----- Needs ER follow-up scheduled.  This is one I am tracking no shows and cancellations on, as if too many will discharge him for poor compliance.

## 2019-03-30 NOTE — ED Provider Notes (Signed)
[facility] Emergency Department Provider Note  ____________________________________________  Time seen: Approximately 2:03 AM  I have reviewed the triage vital signs and the nursing notes.   HISTORY  Chief Complaint Numbness and Hypertension   HPI Robert Jacobson is a 43 y.o. 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  Patient reports that he was at a bar shooting pool and drinking beer when he started having a right-sided headache.  Initially was a mild headache but he started becoming progressively worse.  Then started having blurry vision on the right eye and felt numb on the right side of his face.  He went home and check his blood pressure which was elevated and that prompted his visit to the emergency room.  At this time is complaining of right lower leg weakness, right-sided facial droop, and blurry vision on the right eye.  He is complaining of a 10 out of 10 sharp headache on the right side of his head.  No dizziness, no right upper arm weakness, no slurred speech, no facial droop. Last normal midnight.   Past Medical History:  Diagnosis Date   Hypertension    Stroke [facility])     Patient Active Problem List   Diagnosis Date Noted   Subarachnoid hemorrhage (HCC) 04/01/2018   Insomnia 04/01/2018   Essential (primary) hypertension 03/23/2018   Hyperlipidemia 03/23/2018   Alcohol dependence (HCC) 03/23/2018   Cocaine abuse (HCC) 03/23/2018   Nicotine dependence, cigarettes, uncomplicated 03/23/2018   Stroke (HCC) 03/18/2018   Acute ischemic stroke (HCC) 03/18/2018    Past Surgical History:  Procedure Laterality Date   IR ANGIO INTRA EXTRACRAN SEL COM CAROTID INNOMINATE BILAT MOD SED  03/18/2018   IR ANGIO VERTEBRAL SEL VERTEBRAL  BILAT MOD SED  03/18/2018   RADIOLOGY WITH ANESTHESIA N/A 03/18/2018   Procedure: IR WITH ANESTHESIA;  Surgeon: Radiologist, Medication, MD;  Location: [facility] OR;  Service: Radiology;  Laterality: N/A;    Prior to Admission medications   Medication Sig Start Date End Date Taking? Authorizing Provider  aspirin 325 MG EC tablet Take 1 tablet (325 mg total) by mouth daily. 04/20/18   Cannady, Corrie Dandy T, NP  atorvastatin (LIPITOR) 40 MG tablet TAKE ONE TABLET BY MOUTH DAILY AT 6PM 01/16/19   Cannady, Corrie Dandy T, NP  losartan (COZAAR) 50 MG tablet Take 0.5 tablets (25 mg total) by mouth daily. 03/23/18   Cannady, Corrie Dandy T, NP  traZODone (DESYREL) 50 MG tablet Take 1 tablet (50 mg total) by mouth at bedtime. 08/01/18   Marjie Skiff, NP    Allergies Patient has no known allergies.  Family History  Problem Relation Age of Onset   Hypertension Mother    Hypertension Father    Glaucoma Father    Stroke Maternal Grandmother    Cerebral aneurysm Maternal Grandfather     Social History Social History   Tobacco Use   Smoking status: Current Every Day Smoker    Packs/day: 1.00    Years: 25.00    Pack years: 25.00    Types: Cigarettes   Smokeless tobacco: Never Used   Tobacco comment: 04/05/18 <1 ppd  Substance Use Topics   Alcohol use: Yes    Alcohol/week: 35.0 standard drinks    Types: 35 Cans of beer per week    Comment: 5 beers per day; 04/05/18  now 3-4 beers per day, some days none.   Drug use: Yes    Types: Cocaine, Marijuana    Comment: Hasn't used cocaine since before stroke. Still using Marijuana. 04/01/18    Review of Systems  Constitutional: Negative for fever. Eyes: Negative for visual changes. ENT: Negative for sore throat. Neck: No neck pain  Cardiovascular: Negative for chest pain. Respiratory: Negative for shortness of breath. Gastrointestinal: Negative for abdominal pain, vomiting or diarrhea. Genitourinary: Negative for dysuria. Musculoskeletal:  Negative for back pain. Skin: Negative for rash. Neurological: + HA, R facial numbness, RLE weakness Psych: No SI or HI  ____________________________________________   PHYSICAL EXAM:  VITAL SIGNS: ED Triage Vitals  Enc Vitals Group     BP 03/30/19 0118 (!) 174/118     Pulse Rate 03/30/19 0118 96     Resp 03/30/19 0118 20     Temp 03/30/19 0118 98.2 F (36.8 C)     Temp Source 03/30/19 0118 Oral     SpO2 03/30/19 0118 99 %     Weight 03/30/19 0119 154 lb 15.7 oz (70.3 kg)     Height --      Head Circumference --      Peak Flow --      Pain Score --      Pain Loc --      Pain Edu? --      Excl. in [place]? --     Constitutional: Alert and oriented. Well appearing and in no apparent distress. HEENT:      Head: Normocephalic and atraumatic.         Eyes: Conjunctivae are normal. Sclera is non-icteric.       Mouth/Throat: Mucous membranes are moist.       Neck: Supple with no signs of meningismus. Cardiovascular: Regular rate and rhythm. No murmurs, gallops, or rubs. 2+ symmetrical distal pulses are present in all extremities. No JVD. Respiratory: Normal respiratory effort. Lungs are clear to auscultation bilaterally. No wheezes, crackles, or rhonchi.  Gastrointestinal: Soft, non tender, and non distended with positive bowel sounds. No rebound or guarding. Genitourinary: No CVA tenderness. Musculoskeletal: Nontender with normal range of motion in all extremities. No edema, cyanosis, or erythema of extremities. Neuro: A & O x3, PERRL, EOMI, no nystagmus, CN II-XII intact, motor testing reveals good tone and bulk throughout. There is no evidence of pronator drift or dysmetria. Muscle strength 5-/5 on the RLE otherwise is 5/5 x3. Decreased sensation to touch to the R side of his face with no extinction. Skin: Skin is warm, dry and intact. No rash noted. Psychiatric: Mood and affect are normal. Speech and behavior are normal.  NIH Stroke Scale  Interval: Baseline Time: 3:35  AM Person Administering Scale: Brilliant stroke scale items in the order listed. Record performance in each category after each subscale exam. Do not go back and change scores. Follow directions provided for each exam technique. Scores should reflect what the patient does, not what the clinician thinks the patient can do. The clinician should record answers while administering the exam and work quickly. Except where indicated, the patient should not be coached (i.e., repeated requests to patient to make a special effort).   1a  Level of consciousness: 0=alert; keenly responsive  1b. LOC questions:  0=Performs both tasks correctly  1c. LOC commands: 0=Performs both tasks correctly  2.  Best Gaze: 0=normal  3.  Visual: 0=No visual loss  4. Facial Palsy: 0=Normal symmetric movement  5a.  Motor left arm: 0=No drift, limb holds 90 (or 45) degrees for full 10 seconds  5b.  Motor right arm: 0=No drift, limb holds 90 (or 45) degrees for full 10 seconds  6a. motor left leg: 0=No drift, limb holds 90 (or 45) degrees for full 10 seconds  6b  Motor right leg:  1=Drift, limb holds 90 (or 45) degrees but drifts down before full 10 seconds: does not hit bed  7. Limb Ataxia: 0=Absent  8.  Sensory: 1=Mild to moderate sensory loss; patient feels pinprick is less sharp or is dull on the affected side; there is a loss of superficial pain with pinprick but patient is aware He is being touched  9. Best Language:  0=No aphasia, normal  10. Dysarthria: 0=Normal  11. Extinction and Inattention: 0=No abnormality   Total:   2     ____________________________________________   LABS (all labs ordered are listed, but only abnormal results are displayed)  Labs Reviewed  COMPREHENSIVE METABOLIC PANEL - Abnormal; Notable for the following components:      Result Value   Total Protein 8.6 (*)    All other components within normal limits  URINE DRUG SCREEN, QUALITATIVE ([facility] ONLY) - Abnormal;  Notable for the following components:   Cocaine Metabolite,[place] POSITIVE (*)    All other components within normal limits  PROTIME-INR  APTT  CBC  DIFFERENTIAL  GLUCOSE, CAPILLARY  ETHANOL  CBG MONITORING, ED  I-STAT CREATININE, ED   ____________________________________________  EKG  ED ECG REPORT I, Nita Sicklearolina Vivian Neuwirth, the attending physician, personally viewed and interpreted this ECG.   Normal sinus rhythm, rate of 85, normal intervals, normal axis, no ST elevations or depressions.  Normal EKG. ____________________________________________  RADIOLOGY  I have personally reviewed the images performed during this visit and I agree with the Radiologist's read.   Interpretation by Radiologist:  Ct Head Code Stroke Wo Contrast`  Result Date: 03/30/2019 CLINICAL DATA:  Code stroke. Initial evaluation for acute right facial numbness. EXAM: CT HEAD WITHOUT CONTRAST TECHNIQUE: Contiguous axial images were obtained from the base of the skull through the vertex without intravenous contrast. COMPARISON:  None. FINDINGS: Brain: Cerebral volume within normal limits for patient age. No evidence for acute intracranial hemorrhage. No findings to suggest acute large vessel territory infarct. No mass lesion, midline shift, or mass effect. Ventricles are normal in size without evidence for hydrocephalus. No extra-axial fluid collection identified. Vascular: No hyperdense vessel identified. Skull: Scalp soft tissues demonstrate no acute abnormality. Calvarium intact. Sinuses/Orbits: Globes and orbital soft tissues within normal limits. Left sphenoid sinusitis noted. Paranasal sinuses are otherwise clear. No mastoid effusion. ASPECTS [facility]([place] Stroke Program Early CT Score) - Ganglionic level infarction (caudate, lentiform nuclei, internal capsule, insula, M1-M3 cortex): 7 - Supraganglionic infarction (M4-M6 cortex): 3 Total score (0-10 with 10 being normal): 10 IMPRESSION: 1. No acute intracranial infarct  or other abnormality identified. 2. ASPECTS is 10. Critical Value/emergent results were called by telephone at the time of interpretation on 03/30/2019 at 1:31 am to [facility] CenterproviderCAROLINA Novella Abraha , who verbally acknowledged these results. Electronically Signed   By: Rise MuBenjamin  McClintock M.D.   On: 03/30/2019 01:32     ____________________________________________   PROCEDURES  Procedure(s) performed: None Procedures Critical Care performed:  None ____________________________________________   INITIAL IMPRESSION / ASSESSMENT AND PLAN / ED COURSE  [age] y.o. 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 CODE STROKE called in triage.  NIH stroke scale of 2. Patient evaluated by myself and teleneurologist on arrival. CT negative. Done 1.5 hrs from onset of symptoms with no signs of SAH. Neurology recommended CTA head and neck to eval for another dissection. Also recommended ASA, hold Plavix and tPA and admission to the hospitalist for stroke evaluation.    _________________________ 3:31 AM on 03/30/2019 -----------------------------------------  CTA results pending. Patient now demanding to be discharged home since he has to go to work in the morning. I explained to the patient that we do not have the results of his CTA and we unable to rule out a dissection or aneurysm which can cause a much larger stroke or fatal brain bleed. Patient understands the recommendations and the need to stay. He understands the risks associated with leaving including coma, death, brain bleed, massive stroke. He continues to endorse the need to return to work in the morning. I recommended close f/u with his doctor and told him, he is welcome to return at any time if he changes his mind and wishes to continue his treatment and  evaluation. His labs were ENL. Tox screen positive for cocaine. EKG no dysrhythmias.  _________________________ 4:49 AM on 03/30/2019 -----------------------------------------  CTA head and neck negative   As part of my medical decision making, I reviewed the following data within the electronic MEDICAL RECORD NUMBER Nursing notes reviewed and incorporated, Labs reviewed , EKG interpreted , Old EKG reviewed, Old chart reviewed, Radiograph reviewed , A consult was requested and obtained from this/these consultant(s) Neurology, Notes from prior ED visits and [place] Controlled Substance Database   Patient was evaluated in Emergency Department today for the symptoms described in the history of present illness. Patient was evaluated in the context of the global COVID-19 pandemic, which necessitated consideration that the patient might be at risk for infection with the SARS-CoV-2 virus that causes COVID-19. Institutional protocols and algorithms that pertain to the evaluation of patients at risk for COVID-19 are in a state of rapid change based on information released by regulatory bodies including the CDC and federal and state organizations. These policies and algorithms were followed during the patient's care in the ED.   ____________________________________________   FINAL CLINICAL IMPRESSION(S) / ED DIAGNOSES   Final diagnoses:  Cerebrovascular accident (CVA), unspecified mechanism (HCC)  Cocaine abuse (HCC)      NEW MEDICATIONS STARTED DURING THIS VISIT:  ED Discharge Orders    None       Note:  This document was prepared using Dragon voice recognition software and may include unintentional dictation errors.    Nita Sickle, MD 03/30/19 [phone]

## 2019-03-30 NOTE — Consult Note (Signed)
[facility] TeleNeurology Consult Services   TeleStroke Metrics: LKW: 2230 Door Time: 0108  [facility] Contacted: 0130  [facility] at Bedside: 0133 NIHSS: 0146 Decision on Alteplase: Patient has deferred IV alteplase administration due to concerns of internal bleeding.  He has opted for a more conservative approach. Interventional Candidate: Not a candidate as his symptoms are not consistent with a large vessel proximal occlusion.   Chief Complaint: Acute right-sided headache, right facial numbness, blurry vision, and right-sided weakness   HPI: Asked to see this patient in emergent telemedicine consultation utilizing interactive audio and video technologies. Consultation was performed with assistance of ancillary / medical staff at bedside. Verbal consent to perform the examination with telemedicine was obtained. Patient agreed to proceed with the consultation for acute stroke protocol.  43 year old right-handed white male who comes to the emergency room by private vehicle with acute right-sided headache, right eye blurry vision, right facial numbness, and right-sided weakness.  Patient admits not taking his aspirin consistently.  He still smokes.  Review of the medical record showed that back on March 18, 2018, patient presented with an episode of right amaurosis fugax and left-sided weakness.  CTA head and neck showed a right ICA occlusion possibly due to dissection and partial occlusion of the right M1 and right anterior M2 segments.  He was started on aspirin and Plavix.  MRI brain showed 2 small right MCA strokes.  He then presented back to the ER on 03/28/2018 with right temporal paresthesias and headaches.  Head CT showed a small right frontal subarachnoid hemorrhage that did not appear aneurysmal.  It was thought this was due to his Plavix, and this was discontinued.  Patient had a follow-up CTA head and neck in March 2020 that showed that the right ICA is patent and the  right ICA dissection was healed.  Patient denies any history for migraines.  Patient states that around 10:30 PM, he was getting ready to leave the bar when he had acute right-sided headache.  He initially scored it as 8/10 on pain scale.  He also developed right eye blurry vision and right facial numbness.  He also developed some right-sided weakness.  He got home around 11 PM and checked his blood pressure and it was 165/140.  He noted that his symptoms continued to get worse and he took some aspirin.  He does note some photophobia but no nausea.  Currently his blood pressure remains elevated with systolics in the 676P.  He does note some mild improvement in his headache.  I reviewed with the patient about the availability of IV alteplase.  I reviewed with him about some of the potential side effects of IV alteplase to include an approximate 6% risk of symptomatic intracranial hemorrhage, internal bleeding, and/or angioedema.  At the end of the day, patient has deferred IV alteplase administration due to concerns of internal bleeding.  He has opted for a more conservative approach.   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  Patient denies any current cocaine use.  He lives alone.   [place]: Significant for brain aneurysm, hypertension, and glaucoma   ROS: 13 point review systems were reviewed with the patient, and are all negative with the exception of the aforementioned in the history of present illness.   VS: Temperature 98.2 F, pulse 96, respiration 20, blood pressure 174/118, oxygen saturation 99%  Exam: Patient is in no apparent distress.  Patient appears as stated age.  No obvious acute respiratory or cardiac distress.  Patient is well groomed and well-nourished. 1a- LOC: Keenly responsive - 0 1b- LOC questions: Answers both  questions correctly - 0 1c- LOC commands- Performs both tasks correctly- 0 2- Gaze: Normal; no gaze paresis or gaze deviation - 0 3- Visual Fields: normal, no Visual field deficit - 0 4- Facial movements: no facial palsy - 0 5- Upper limb motor - no drift - 0 6- Lower limb motor - no drift - 0 7- Limb Coordination: absent ataxia - 0 8- Sensory: no sensory loss - 0 9- Language - No aphasia - 0 10- Speech - No dysarthria -0 11- Neglect / Extinction - none found - 0 NIHSS score:   Diagnostic Data:   Medical Data Reviewed: 1.Data?reviewed include clinical labs, radiology,?and medical tests; 2.Tests?results discussed w/performing or interpreting physician; 3.Obtaining/reviewing old medical records; 4.Obtaining?case history from another source; 5.Independent?review of image, tracing, or specimen.   Medical Decision Making: - Extensive number of diagnosis or management options are considered below. - Extensive amount of complex data reviewed. - High risk of complication and/or morbidity or mortality are associated with differential diagnostic considerations below. - There may be?uncertain?outcome and increased probability of prolonged functional impairment or high probability of severe prolonged functional impairment associated with some of these differential diagnosis.   Differential Diagnosis for Stroke: 1.?Cardioembolic?stroke 2. Small vessel disease/lacune 3. Thromboembolic, artery-to-artery mechanism 4.?Hypercoagulable?state-related infarct 5. Transient ischemic attack 6. Thrombotic mechanism, large artery disease   Assessment: 1.  Right sided headaches with right facial numbness and blurry vision.  Rule out recurrent right ICA dissection. 2.  Right-sided weakness possibly due to left MCA subcortical stroke 3.  Hypertension 4.  Tobacco and alcohol abuse 5.  Prior cocaine abuse 6.  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 Check MRI brain without contrast to rule out any acute intracranial process Check echocardiogram to gauge his cardiac function Maintain the patient on telemetry to look for paroxysmal atrial fibrillation Check hemoglobin A1c, lipid panel, and urine drug screen Consult PT, OT, and ST Continue supportive care Plan of care was discussed with the patient  Thank you for allowing [facility] to participate in the care of your patient. Please call me, Dr. Adrienne Mocha, with any questions at [phone]. Case discussed with the ER staff and Dr. Don Perking.   Critical Care notation:   I was called to see this critical patient emergently. I personally evaluated this critical patient for acute stroke evaluation, and determining their eligibility for IV Alteplase and interventional therapies.  I have spent approximately 17 minutes with the patient, including time at bedside, time discussing the case with other physicians, reviewing plan of care, and time independently reviewing the records and scans.

## 2019-03-30 NOTE — Telephone Encounter (Signed)
Noted, thank you

## 2019-04-04 ENCOUNTER — Telehealth (HOSPITAL_COMMUNITY): Payer: Self-pay

## 2019-04-04 NOTE — Telephone Encounter (Signed)
Pt agreed to f/u in 1 year with cta head/neck. AW  

## 2021-07-03 ENCOUNTER — Observation Stay: Payer: Self-pay

## 2021-07-03 ENCOUNTER — Observation Stay
Admission: EM | Admit: 2021-07-03 | Discharge: 2021-07-04 | Disposition: A | Discharge status: Home / Self Care | Payer: Self-pay | Attending: Internal Medicine | Admitting: Internal Medicine

## 2021-07-03 ENCOUNTER — Encounter: Payer: Self-pay | Admitting: Internal Medicine

## 2021-07-03 ENCOUNTER — Emergency Department: Payer: Self-pay

## 2021-07-03 ENCOUNTER — Other Ambulatory Visit: Payer: Self-pay

## 2021-07-03 DIAGNOSIS — Z79899 Other long term (current) drug therapy: Secondary | ICD-10-CM | POA: Insufficient documentation

## 2021-07-03 DIAGNOSIS — E782 Mixed hyperlipidemia: Secondary | ICD-10-CM

## 2021-07-03 DIAGNOSIS — I10 Essential (primary) hypertension: Secondary | ICD-10-CM | POA: Insufficient documentation

## 2021-07-03 DIAGNOSIS — E785 Hyperlipidemia, unspecified: Secondary | ICD-10-CM | POA: Insufficient documentation

## 2021-07-03 DIAGNOSIS — Z7982 Long term (current) use of aspirin: Secondary | ICD-10-CM | POA: Insufficient documentation

## 2021-07-03 DIAGNOSIS — G459 Transient cerebral ischemic attack, unspecified: Principal | ICD-10-CM | POA: Insufficient documentation

## 2021-07-03 DIAGNOSIS — F17210 Nicotine dependence, cigarettes, uncomplicated: Secondary | ICD-10-CM | POA: Insufficient documentation

## 2021-07-03 DIAGNOSIS — F1410 Cocaine abuse, uncomplicated: Secondary | ICD-10-CM | POA: Insufficient documentation

## 2021-07-03 DIAGNOSIS — I639 Cerebral infarction, unspecified: Secondary | ICD-10-CM | POA: Diagnosis present

## 2021-07-03 DIAGNOSIS — Z20822 Contact with and (suspected) exposure to covid-19: Secondary | ICD-10-CM | POA: Insufficient documentation

## 2021-07-03 DIAGNOSIS — Y904 Blood alcohol level of 80-99 mg/100 ml: Secondary | ICD-10-CM | POA: Insufficient documentation

## 2021-07-03 DIAGNOSIS — F1020 Alcohol dependence, uncomplicated: Secondary | ICD-10-CM | POA: Insufficient documentation

## 2021-07-03 DIAGNOSIS — Z8673 Personal history of transient ischemic attack (TIA), and cerebral infarction without residual deficits: Secondary | ICD-10-CM | POA: Insufficient documentation

## 2021-07-03 LAB — COMPREHENSIVE METABOLIC PANEL
ALT: 18 U/L (ref 0–44)
AST: 23 U/L (ref 15–41)
Albumin: 5 g/dL (ref 3.5–5.0)
Alkaline Phosphatase: 56 U/L (ref 38–126)
Anion gap: 10 (ref 5–15)
BUN: 8 mg/dL (ref 6–20)
CO2: 24 mmol/L (ref 22–32)
Calcium: 9.3 mg/dL (ref 8.9–10.3)
Chloride: 102 mmol/L (ref 98–111)
Creatinine, Ser: 0.65 mg/dL (ref 0.61–1.24)
GFR, Estimated: >60 mL/min (ref >60)
Glucose, Bld: 108 mg/dL — ABNORMAL HIGH (ref 70–99)
Potassium: 3.7 mmol/L (ref 3.5–5.1)
Sodium: 136 mmol/L (ref 135–145)
Total Bilirubin: 0.7 mg/dL (ref 0.3–1.2)
Total Protein: 8.5 g/dL — ABNORMAL HIGH (ref 6.5–8.1)

## 2021-07-03 LAB — DIFFERENTIAL
Abs Immature Granulocytes: 0.02 10*3/uL (ref 0.00–0.07)
Basophils Absolute: 0 10*3/uL (ref 0.0–0.1)
Basophils Relative: 1 %
Eosinophils Absolute: 0.1 10*3/uL (ref 0.0–0.5)
Eosinophils Relative: 2 %
Immature Granulocytes: 0 %
Lymphocytes Relative: 19 %
Lymphs Abs: 1.5 10*3/uL (ref 0.7–4.0)
Monocytes Absolute: 0.5 10*3/uL (ref 0.1–1.0)
Monocytes Relative: 7 %
Neutro Abs: 5.4 10*3/uL (ref 1.7–7.7)
Neutrophils Relative %: 71 %

## 2021-07-03 LAB — APTT: aPTT: 26 seconds (ref 24–36)

## 2021-07-03 LAB — ETHANOL: Alcohol, Ethyl (B): 88 mg/dL — ABNORMAL HIGH (ref <10)

## 2021-07-03 LAB — PROTIME-INR
INR: 0.9 (ref 0.8–1.2)
Prothrombin Time: 12 seconds (ref 11.4–15.2)

## 2021-07-03 LAB — CBC
HCT: 47.1 % (ref 39.0–52.0)
Hemoglobin: 15.7 g/dL (ref 13.0–17.0)
MCH: 31.8 pg (ref 26.0–34.0)
MCHC: 33.3 g/dL (ref 30.0–36.0)
MCV: 95.3 fL (ref 80.0–100.0)
Platelets: 309 10*3/uL (ref 150–400)
RBC: 4.94 MIL/uL (ref 4.22–5.81)
RDW: 12.7 % (ref 11.5–15.5)
WBC: 7.6 10*3/uL (ref 4.0–10.5)
nRBC: 0 % (ref 0.0–0.2)

## 2021-07-03 LAB — URINE DRUG SCREEN, QUALITATIVE (ARMC ONLY)
Amphetamines, Ur Screen: NOT DETECTED
Barbiturates, Ur Screen: NOT DETECTED
Benzodiazepine, Ur Scrn: NOT DETECTED
Cannabinoid 50 Ng, Ur ~~LOC~~: NOT DETECTED
Cocaine Metabolite,Ur ~~LOC~~: POSITIVE — AB
MDMA (Ecstasy)Ur Screen: NOT DETECTED
Methadone Scn, Ur: NOT DETECTED
Opiate, Ur Screen: NOT DETECTED
Phencyclidine (PCP) Ur S: NOT DETECTED
Tricyclic, Ur Screen: NOT DETECTED

## 2021-07-03 LAB — URINALYSIS, ROUTINE W REFLEX MICROSCOPIC
Bacteria, UA: NONE SEEN
Bilirubin Urine: NEGATIVE
Glucose, UA: NEGATIVE mg/dL
Hgb urine dipstick: NEGATIVE
Ketones, ur: NEGATIVE mg/dL
Leukocytes,Ua: NEGATIVE
Nitrite: NEGATIVE
Protein, ur: NEGATIVE mg/dL
Specific Gravity, Urine: <1.005 — ABNORMAL LOW (ref 1.005–1.030)
Squamous Epithelial / HPF: NONE SEEN (ref 0–5)
WBC, UA: NONE SEEN WBC/hpf (ref 0–5)
pH: 5.5 (ref 5.0–8.0)

## 2021-07-03 LAB — RESP PANEL BY RT-PCR (FLU A&B, COVID) ARPGX2
Influenza A by PCR: NEGATIVE
Influenza B by PCR: NEGATIVE
SARS Coronavirus 2 by RT PCR: NEGATIVE

## 2021-07-03 LAB — CBG MONITORING, ED: Glucose-Capillary: 119 mg/dL — ABNORMAL HIGH (ref 70–99)

## 2021-07-03 MED ORDER — ACETAMINOPHEN 325 MG PO TABS: 650.0000 mg | ORAL_TABLET | ORAL | Status: DC | PRN

## 2021-07-03 MED ORDER — LORAZEPAM 1 MG PO TABS
1.0000 mg | ORAL_TABLET | ORAL | Status: DC | PRN
  Administered 2021-07-03: 21:00:00 2 mg via ORAL
  Administered 2021-07-03: 18:00:00 1 mg via ORAL
  Filled 2021-07-03: qty 2
  Filled 2021-07-03: qty 1

## 2021-07-03 MED ORDER — NICOTINE 21 MG/24HR TD PT24
21.0000 mg | MEDICATED_PATCH | Freq: Every day | TRANSDERMAL | Status: DC
  Administered 2021-07-03 – 2021-07-04 (×2): 21 mg via TRANSDERMAL
  Filled 2021-07-03 (×2): qty 1

## 2021-07-03 MED ORDER — STROKE: EARLY STAGES OF RECOVERY BOOK: Freq: Once | Status: AC

## 2021-07-03 MED ORDER — HEPARIN SODIUM (PORCINE) 5000 UNIT/ML IJ SOLN
5000.0000 [IU] | Freq: Three times a day (TID) | INTRAMUSCULAR | Status: DC
  Administered 2021-07-03 – 2021-07-04 (×2): 5000 [IU] via SUBCUTANEOUS
  Filled 2021-07-03 (×2): qty 1

## 2021-07-03 MED ORDER — LABETALOL HCL 5 MG/ML IV SOLN
10.0000 mg | Freq: Once | INTRAVENOUS | Status: AC
  Administered 2021-07-03: 10 mg via INTRAVENOUS
  Filled 2021-07-03: qty 4

## 2021-07-03 MED ORDER — THIAMINE HCL 100 MG/ML IJ SOLN
100.0000 mg | Freq: Every day | INTRAMUSCULAR | Status: DC
  Filled 2021-07-03: qty 2

## 2021-07-03 MED ORDER — ASPIRIN EC 325 MG PO TBEC
325.0000 mg | DELAYED_RELEASE_TABLET | Freq: Every day | ORAL | Status: DC
  Administered 2021-07-03 – 2021-07-04 (×2): 325 mg via ORAL
  Filled 2021-07-03 (×2): qty 1

## 2021-07-03 MED ORDER — LORAZEPAM 2 MG/ML IJ SOLN: 1.0000 mg | INTRAMUSCULAR | Status: DC | PRN

## 2021-07-03 MED ORDER — FOLIC ACID 1 MG PO TABS
1.0000 mg | ORAL_TABLET | Freq: Every day | ORAL | Status: DC
  Administered 2021-07-03 – 2021-07-04 (×2): 1 mg via ORAL
  Filled 2021-07-03 (×2): qty 1

## 2021-07-03 MED ORDER — THIAMINE HCL 100 MG PO TABS
100.0000 mg | ORAL_TABLET | Freq: Every day | ORAL | Status: DC
  Administered 2021-07-03 – 2021-07-04 (×2): 100 mg via ORAL
  Filled 2021-07-03 (×2): qty 1

## 2021-07-03 MED ORDER — ADULT MULTIVITAMIN W/MINERALS CH
1.0000 | ORAL_TABLET | Freq: Every day | ORAL | Status: DC
  Administered 2021-07-03 – 2021-07-04 (×2): 1 via ORAL
  Filled 2021-07-03 (×2): qty 1

## 2021-07-03 MED ORDER — ACETAMINOPHEN 160 MG/5ML PO SOLN
650.0000 mg | ORAL | Status: DC | PRN
  Filled 2021-07-03: qty 20.3

## 2021-07-03 MED ORDER — ACETAMINOPHEN 650 MG RE SUPP: 650.0000 mg | RECTAL | Status: DC | PRN

## 2021-07-03 NOTE — Consult Note (Signed)
NEURO HOSPITALIST CONSULT NOTE   Requestig physician: Dr. Cyril LoosenKinner  Reason for Consult: Acute onset of right face and arm numbness with RUE weakness  History obtained from:   Patient and Chart     HPI:                                                                                                                                          Robert Jacobson is an 46 y.o. male with a PMHx of stroke x 1 who presented to the ED today for evaluation after he experience acute onset of right arm weakness and vision blurriness that were first noticed on awakening at 0700. He decided to stay home due to "hardheadedness." He admits to a few beers after symptoms started, which he felt might help them resolve. LKN was 2300.  On presentation to the ED he was out of the TNK time window due to time criteria. His presentation was not consistent with LVO.   He states that he has not been compliant with ASA or antihypertensives previously prescribed to him due to financial issues.   He states that his prior stroke occurred at a time of his life when he was using cocaine, but that the old stroke had occurred after 2-3 weeks following his last cocaine use. He endorses essentially the same thing today, stating last cocaine use was 2-3 weeks ago. He gave a different explanation to EDP stating last use was 4 days ago. Of note, UTOX in the ED was positive for cocaine this visit.   He states that his symptoms are now resolved, and had been resolving while he was in the ED.   EDP note reviewed: "Robert FenderChristopher L Heppler is a 46 y.o. male with a history of CVA, cocaine abuse, alcohol dependence, subarachnoid hemorrhage who presents with complaints of right arm numbness and heaviness, and abnormal feeling in his right face.  Denies headache.  He reports he woke up with the symptoms around 7 AM this morning.  They have not improved or worsened.  No leg weakness.  He is able to move his right upper extremity  but it feels atypical.  Has not been taking blood pressure medication because he cannot afford it"  CT head: No intracranial abnormality. Bilateral frontal chronic sinusitis.  Review of records reveals that his stroke in October of 2019 involved right sided amaurosis fugax with transient left sided weakness. 4 vessel angiogram at that time revealed angiographically complete occlusion of the right internal carotid artery in the proximal petrous segment associated with a severe high-grade pre occlusive tapered stenosis at the cervical petrous junction. Findings were most consistent with dissection. There was adequate compensation of the right anterior circulation from the anterior communicating artery, and also ipsilateral right posterior communicating artery.  Past Medical History:  Diagnosis Date   Hypertension    Stroke [facility])     Past Surgical History:  Procedure Laterality Date   IR ANGIO INTRA EXTRACRAN SEL COM CAROTID INNOMINATE BILAT MOD SED  03/18/2018   IR ANGIO VERTEBRAL SEL VERTEBRAL BILAT MOD SED  03/18/2018   RADIOLOGY WITH ANESTHESIA N/A 03/18/2018   Procedure: IR WITH ANESTHESIA;  Surgeon: Radiologist, Medication, MD;  Location: [facility] OR;  Service: Radiology;  Laterality: N/A;    Family History  Problem Relation Age of Onset   Hypertension Mother    Hypertension Father    Glaucoma Father    Stroke Maternal Grandmother    Cerebral aneurysm Maternal Grandfather             Social History:  reports that he has been smoking cigarettes. He has a 25.00 pack-year smoking history. He has never used smokeless tobacco. He reports current alcohol use of about 35.0 standard drinks per week. He reports current drug use. Drugs: Cocaine and Marijuana.  No Known Allergies  MEDICATIONS:                                                                                                                     Home Medications: No current facility-administered medications on file prior to  encounter.   Current Outpatient Medications on File Prior to Encounter  Medication Sig Dispense Refill   aspirin 325 MG EC tablet Take 1 tablet (325 mg total) by mouth daily. (Patient not taking: Reported on 07/03/2021) 90 tablet 3   atorvastatin (LIPITOR) 40 MG tablet TAKE ONE TABLET BY MOUTH DAILY AT 6PM (Patient not taking: Reported on 07/03/2021) 30 tablet 0     Inpatient Scheduled:  aspirin  325 mg Oral Daily   folic acid  1 mg Oral Daily   heparin  5,000 Units Subcutaneous Q8H   multivitamin with minerals  1 tablet Oral Daily   nicotine  21 mg Transdermal Daily   thiamine  100 mg Oral Daily   Or   thiamine  100 mg Intravenous Daily      ROS:                                                                                                                                       As per HPI. Does not endorse any additional complaints.    Blood pressure (!) 151/114, pulse 84, temperature (!) 97.3  F (36.3 C), temperature source Oral, resp. rate 17, height 5\' 11"  (1.803 m), weight 67 kg, SpO2 100 %.   General Examination:                                                                                                       Physical Exam  HEENT-  [place]/AT. Poor dentition.     Lungs- Respirations unlabored Extremities- No edema   Neurological Examination Mental Status: Alert, fully oriented, thought content appropriate. Speech fluent without evidence of aphasia.  Able to follow all commands without difficulty. Cranial Nerves: II: Temporal visual fields intact with no extinction to DSS. PERRL.  III,IV, VI: No ptosis. EOMI. No nystagmus.  V: Temp sensation intact bilaterally VII: Smile symmetric VIII: Hearing intact to voice IX,X: No hoarseness or hypophonia XI: Symmetric XII: Midline tongue extension Motor: Right : Upper extremity   5/5    Left:     Upper extremity   5/5  Lower extremity   5/5     Lower extremity   5/5 No pronator drift.  Sensory: Temp and light touch intact  throughout, bilaterally. No extinction to DSS Deep Tendon Reflexes: 2+ and symmetric throughout Cerebellar: No ataxia with FNF bilaterally Gait: Deferred   Lab Results: Basic Metabolic Panel: Recent Labs  Lab 07/03/21 1345  NA 136  K 3.7  CL 102  CO2 24  GLUCOSE 108*  BUN 8  CREATININE 0.65  CALCIUM 9.3    CBC: Recent Labs  Lab 07/03/21 1345  WBC 7.6  NEUTROABS 5.4  HGB 15.7  HCT 47.1  MCV 95.3  PLT 309    Cardiac Enzymes: No results for input(s): CKTOTAL, CKMB, CKMBINDEX, TROPONINI in the last 168 hours.  Lipid Panel: No results for input(s): CHOL, TRIG, HDL, CHOLHDL, VLDL, LDLCALC in the last 168 hours.  Imaging: CT HEAD WO CONTRAST  Result Date: 07/03/2021 CLINICAL DATA:  Right arm weakness and blurred vision since 7 a.m. today. EXAM: CT HEAD WITHOUT CONTRAST TECHNIQUE: Contiguous axial images were obtained from the base of the skull through the vertex without intravenous contrast. RADIATION DOSE REDUCTION: This exam was performed according to the departmental dose-optimization program which includes automated exposure control, adjustment of the mA and/or kV according to patient size and/or use of iterative reconstruction technique. COMPARISON:  03/30/2019 FINDINGS: Brain: Normal appearing cerebral hemispheres and posterior fossa structures. Normal size and position of the ventricles. No intracranial hemorrhage, mass lesion or CT evidence of acute infarction. Vascular: No hyperdense vessel or unexpected calcification. Skull: Normal. Negative for fracture or focal lesion. Sinuses/Orbits: Left sphenoid sinus retention cyst. Moderate mucosal thickening in the posterior aspects of both frontal sinuses. Unremarkable orbits. Other: Mild left concha bullosa. IMPRESSION: 1. No intracranial abnormality. 2. Bilateral frontal chronic sinusitis. Electronically Signed   By: Beckie SaltsSteven  Reid M.D.   On: 07/03/2021 14:03     Assessment: 46 year old male smoker with a history of cocaine use  and prior stroke presenting with acute onset of right face and arm numbness with RUE weakness 1. Exam reveals no focal deficits 2. CT  head:  No intracranial abnormality. Bilateral frontal chronic sinusitis. 3. MRI brain: No acute finding. Single punctate focus of T2 and FLAIR signal within the right posterior frontal vertex white matter, sequela of an acute infarction in 2019. 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 4. Not classifiable as having failed ASA as the patient states that he has been noncompliant with medications due to financial concerns.  5. The patient states that last cocaine use was about 2-3 weeks ago. However, tox screen is positive for cocaine metabolite. RCVS and sympathomimetic drug use with malignant HTN are therefore relatively high on the DDx.  6. Stroke risk factors: Prior stroke, cocaine and tobacco use, HTN  Recommendations: 1. HgbA1c, fasting lipid panel 2. CTA of head and neck 3. PT consult, OT consult, Speech consult 4. Echocardiogram 5. Given probable stroke mechanisms outlined above, unclear if atorvastatin is indicated unless CTA reveals significant atherosclerotic disease 6. Restart ASA:  7. Drug cessation counseling 8. Telemetry monitoring 9. Frequent neuro checks 10. NPO until passes stroke swallow screen 11. Management of BP per standard protocol with goal of 120/80 as TIA mechanism more likely to be secondary to substance induced malignant HTN leading to neurological symptoms rather than the reverse.  12. Smoking cessation.    Electronically signed: Dr. Caryl Pina 07/03/2021, 2:47 PM

## 2021-07-03 NOTE — ED Triage Notes (Signed)
Patient states he has right arm weakness and vision blurriness  that started at 0700. He decided to stay home due to "hardheadedness." He admits to a few beers after symptoms started.

## 2021-07-03 NOTE — ED Notes (Signed)
Informed RN bed assigned 

## 2021-07-03 NOTE — ED Notes (Signed)
Per ED charge, purple man will be assigned shortly.

## 2021-07-03 NOTE — Assessment & Plan Note (Signed)
-  Nicotine patch 

## 2021-07-03 NOTE — ED Notes (Signed)
Patient resting comfortably on stretcher in room. RR even and unlabored. Patient asking what the plan for him is. I informed him that I hadnt looked but would check. Patient stated that he was hoping he was being admitted because he was hoping for a couple of days off from work. CT of head was negative but UDS was positive for cocaine. He admits to last using 4 days ago. I encouraged the patient to stop using cocaine and drinking alcohol because with his history of bleeding strokes, it increased his risk of death.

## 2021-07-03 NOTE — Assessment & Plan Note (Signed)
-   CIWA protocol 

## 2021-07-03 NOTE — Assessment & Plan Note (Signed)
Pt has been without PCP and HTN meds for 2-3 years. Hold off additional BP meds until MRI brain. Will need PCP and generic meds that he can afford(i.e. $4 [employer] list).

## 2021-07-03 NOTE — Assessment & Plan Note (Signed)
In 2019. Pt had carotid dissection.

## 2021-07-03 NOTE — Subjective & Objective (Signed)
CC: right facial tingling, right arm paraesthesia HPI: 46 year old male with a history of hypertension not treated due to lack of funds and PCP, 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  Presents to the ER today with onset of tingling of his right temporal area of his face, right arm when he woke up around 7:30 AM.  He states that he thought it was going to go away.  He went to his living room.  Sat in his recliner.  Drank several beers.  After several hours his paresthesias do not go away.  He came to ER.  Patient mitts to using cocaine 3 or 4 days ago.  He has a history of polysubstance abuse.  Patient also complains of blurred vision in his right eye.  Denies any diplopia.  Patient states that he has been without any blood pressure medicines for several years as he lost his insurance.  He has not seen any healthcare providers in several years.  On admission to the ER, patient hypertensive with a blood pressure 193/130.  UDS positive for cocaine.  Alcohol level of 88.  Rest of his labs are unremarkable.  CT head demonstrated no acute intracranial normalities.  [facility] hospitalist contacted for admission.

## 2021-07-03 NOTE — H&P (Signed)
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  Presents to the ER today with onset of tingling of his right temporal area of his face, right arm when he woke up around 7:30 AM.  He states that he thought it was going to go away.  He went to his living room.  Sat in his recliner.  Drank several beers.  After several hours his paresthesias do not go away.  He came to ER.  Patient mitts to using cocaine 3 or 4 days ago.  He has a history of polysubstance abuse.  Patient also complains of blurred vision in his right eye.  Denies any diplopia.  Patient states that he has been without any blood pressure medicines for several years as he lost his insurance.  He has not seen any healthcare providers in several years.  On admission to the ER, patient hypertensive with a blood pressure 193/130.  UDS positive for cocaine.  Alcohol level of 88.  Rest of his labs are unremarkable.  CT head demonstrated no acute intracranial normalities.  [facility] hospitalist contacted for admission.   ED Course: hypertensive on arrival. UDS positive for cocaine. CT head negative.  Review of Systems:  Review of Systems  Constitutional: Negative.  Negative for chills, fever and weight loss.  HENT: Negative.  Negative for ear pain, hearing loss and tinnitus.   Eyes:  Positive for blurred vision.  Respiratory: Negative.  Negative for cough, hemoptysis and sputum production.   Cardiovascular: Negative.  Negative for chest pain, palpitations and orthopnea.  Gastrointestinal: Negative.  Negative for  heartburn, nausea and vomiting.  Genitourinary: Negative.  Negative for dysuria, frequency and urgency.  Musculoskeletal: Negative.  Negative for back pain, myalgias and neck pain.  Skin: Negative.   Neurological:  Positive for tingling.       Right facial numbness over right orbit and right temporal area  Right arm numbness  Endo/Heme/Allergies: Negative.   Psychiatric/Behavioral: Negative.    All other systems reviewed and are negative.  Past Medical History:  Diagnosis Date   Acute ischemic stroke (HCC) 03/18/2018   Hypertension    Stroke (HCC)    Subarachnoid hemorrhage (HCC) 04/01/2018   Noted during 03/28/18    Past Surgical History:  Procedure Laterality Date   IR ANGIO INTRA EXTRACRAN SEL COM CAROTID INNOMINATE BILAT MOD SED  03/18/2018   IR ANGIO VERTEBRAL SEL VERTEBRAL BILAT MOD SED  03/18/2018   RADIOLOGY WITH ANESTHESIA N/A 03/18/2018   Procedure: IR WITH ANESTHESIA;  Surgeon: Radiologist, Medication, MD;  Location: [facility] OR;  Service: Radiology;  Laterality: N/A;     reports that he has been smoking cigarettes. He has a 25.00 pack-year smoking history. He has never used smokeless tobacco. He reports current alcohol use of about 35.0 standard drinks per week. He reports current drug use. Drugs: Cocaine and Marijuana.  No Known Allergies  Family History  Problem Relation Age of Onset   Hypertension Mother    Hypertension Father    Glaucoma Father    Stroke Maternal Grandmother    Cerebral aneurysm Maternal Grandfather  Prior to Admission medications   Medication Sig Start Date End Date Taking? Authorizing Provider  aspirin 325 MG EC tablet Take 1 tablet (325 mg total) by mouth daily. Patient not taking: Reported on 07/03/2021 04/20/18   Aura Dialsannady, Jolene T, NP  atorvastatin (LIPITOR) 40 MG tablet TAKE ONE TABLET BY MOUTH DAILY AT 6PM Patient not taking: Reported on 07/03/2021 01/16/19   Aura Dialsannady, Jolene T, NP  losartan (COZAAR) 50 MG tablet Take 0.5 tablets (25 mg  total) by mouth daily. Patient not taking: Reported on 07/03/2021 03/23/18   Aura Dialsannady, Jolene T, NP  traZODone (DESYREL) 50 MG tablet Take 1 tablet (50 mg total) by mouth at bedtime. Patient not taking: Reported on 07/03/2021 08/01/18   Aura Dialsannady, Jolene T, NP    Physical Exam: Vitals:   07/03/21 1400 07/03/21 1415 07/03/21 1430 07/03/21 1500  BP: (!) 157/109  (!) 151/114 (!) 128/93  Pulse: 87 81 84 78  Resp:   17   Temp:      TempSrc:      SpO2: 99% 100% 100% 100%  Weight:      Height:        Physical Exam Vitals and nursing note reviewed.  Constitutional:      General: He is not in acute distress.    Appearance: Normal appearance. He is normal weight. He is not ill-appearing, toxic-appearing or diaphoretic.  HENT:     Head: Normocephalic and atraumatic.     Nose: Nose normal. No rhinorrhea.  Eyes:     General: No scleral icterus. Cardiovascular:     Rate and Rhythm: Normal rate and regular rhythm.     Pulses: Normal pulses.  Pulmonary:     Effort: Pulmonary effort is normal. No respiratory distress.     Breath sounds: Normal breath sounds. No wheezing or rales.  Abdominal:     General: Abdomen is flat. Bowel sounds are normal. There is no distension.     Palpations: Abdomen is soft.     Tenderness: There is no abdominal tenderness. There is no guarding.  Musculoskeletal:     Right lower leg: No edema.     Left lower leg: No edema.  Skin:    General: Skin is warm and dry.     Capillary Refill: Capillary refill takes less than 2 seconds.  Neurological:     General: No focal deficit present.     Mental Status: He is alert and oriented to person, place, and time.     Labs on Admission: I have personally reviewed following labs and imaging studies  CBC: Recent Labs  Lab 07/03/21 1345  WBC 7.6  NEUTROABS 5.4  HGB 15.7  HCT 47.1  MCV 95.3  PLT 309   Basic Metabolic Panel: Recent Labs  Lab 07/03/21 1345  NA 136  K 3.7  CL 102  CO2 24  GLUCOSE 108*  BUN 8   CREATININE 0.65  CALCIUM 9.3   GFR: Estimated Creatinine Clearance: 111.7 mL/min (by C-G formula based on SCr of 0.65 mg/dL). Liver Function Tests: Recent Labs  Lab 07/03/21 1345  AST 23  ALT 18  ALKPHOS 56  BILITOT 0.7  PROT 8.5*  ALBUMIN 5.0   No results for input(s): LIPASE, AMYLASE in the last 168 hours. No results for input(s): AMMONIA in the last 168 hours. Coagulation Profile: Recent Labs  Lab 07/03/21 1345  INR 0.9   Cardiac Enzymes: No results for input(s): CKTOTAL, CKMB, CKMBINDEX, TROPONINI in the last 168 hours. BNP (last 3 results)  No results for input(s): PROBNP in the last 8760 hours. HbA1C: No results for input(s): HGBA1C in the last 72 hours. CBG: Recent Labs  Lab 07/03/21 1339  GLUCAP 119*   Lipid Profile: No results for input(s): CHOL, HDL, LDLCALC, TRIG, CHOLHDL, LDLDIRECT in the last 72 hours. Thyroid Function Tests: No results for input(s): TSH, T4TOTAL, FREET4, T3FREE, THYROIDAB in the last 72 hours. Anemia Panel: No results for input(s): VITAMINB12, FOLATE, FERRITIN, TIBC, IRON, RETICCTPCT in the last 72 hours. Urine analysis:    Component Value Date/Time   COLORURINE YELLOW 07/03/2021 1345   APPEARANCEUR CLEAR (A) 07/03/2021 1345   APPEARANCEUR Clear 06/16/2013 0936   LABSPEC <1.005 (L) 07/03/2021 1345   LABSPEC 1.028 06/16/2013 0936   PHURINE 5.5 07/03/2021 1345   GLUCOSEU NEGATIVE 07/03/2021 1345   GLUCOSEU Negative 06/16/2013 0936   HGBUR NEGATIVE 07/03/2021 1345   BILIRUBINUR NEGATIVE 07/03/2021 1345   BILIRUBINUR Negative 06/16/2013 0936   KETONESUR NEGATIVE 07/03/2021 1345   PROTEINUR NEGATIVE 07/03/2021 1345   NITRITE NEGATIVE 07/03/2021 1345   LEUKOCYTESUR NEGATIVE 07/03/2021 1345   LEUKOCYTESUR Negative 06/16/2013 0936    Radiological Exams on Admission: I have personally reviewed images CT HEAD WO CONTRAST  Result Date: 07/03/2021 CLINICAL DATA:  Right arm weakness and blurred vision since 7 a.m. today. EXAM: CT  HEAD WITHOUT CONTRAST TECHNIQUE: Contiguous axial images were obtained from the base of the skull through the vertex without intravenous contrast. RADIATION DOSE REDUCTION: This exam was performed according to the departmental dose-optimization program which includes automated exposure control, adjustment of the mA and/or kV according to patient size and/or use of iterative reconstruction technique. COMPARISON:  03/30/2019 FINDINGS: Brain: Normal appearing cerebral hemispheres and posterior fossa structures. Normal size and position of the ventricles. No intracranial hemorrhage, mass lesion or CT evidence of acute infarction. Vascular: No hyperdense vessel or unexpected calcification. Skull: Normal. Negative for fracture or focal lesion. Sinuses/Orbits: Left sphenoid sinus retention cyst. Moderate mucosal thickening in the posterior aspects of both frontal sinuses. Unremarkable orbits. Other: Mild left concha bullosa. IMPRESSION: 1. No intracranial abnormality. 2. Bilateral frontal chronic sinusitis. Electronically Signed   By: Beckie Salts M.D.   On: 07/03/2021 14:03    EKG: I have personally reviewed EKG: NSR    Assessment/Plan Principal Problem:   TIA (transient ischemic attack) Active Problems:   Essential (primary) hypertension   Hyperlipidemia   Alcohol dependence (HCC)   Cocaine abuse (HCC)   Nicotine dependence, cigarettes, uncomplicated   History of stroke    TIA (transient ischemic attack) Admit to medical telemetry bed. Neurology consult. Check MRI brain. Check echo. Check UDS. Pt admits to using cocaine 3-4 days ago.  History of stroke In 2019. Pt had carotid dissection.  Essential (primary) hypertension Pt has been without PCP and HTN meds for 2-3 years. Hold off additional BP meds until MRI brain. Will need PCP and generic meds that he can afford(i.e. $4 [employer] list).  Alcohol dependence (HCC) CIWA protocol.  Cocaine abuse (HCC) Pt admits to using cocaine 3-4 days ago.   Urine drug screen positive for cocaine.  Avoid beta-blockers.    Nicotine dependence, cigarettes, uncomplicated Nicotine patch.  Hyperlipidemia Check lipid panel.  DVT prophylaxis: SQ Heparin Code Status: Full Code Family Communication: no family at bedside  Disposition Plan: return home  Consults called: neurology  Admission status: Observation, Telemetry bed   Carollee Herter, DO [facility] 07/03/2021, 3:09 PM

## 2021-07-03 NOTE — ED Provider Notes (Signed)
[facility] Provider Note    Event Date/Time   First MD Initiated Contact with Patient 07/03/21 1333     (approximate)   History   Extremity Weakness   HPI  MORROW FISS is a 46 y.o. male with a history of CVA, cocaine abuse, alcohol dependence, subarachnoid hemorrhage who presents with complaints of right arm numbness and heaviness, and abnormal feeling in his right face.  Denies headache.  He reports he woke up with the symptoms around 7 AM this morning.  They have not improved or worsened.  No leg weakness.  He is able to move his right upper extremity but it feels atypical.  Has not been taking blood pressure medication because he cannot afford it     Physical Exam   Triage Vital Signs: ED Triage Vitals  Enc Vitals Group     BP 07/03/21 1337 (!) 193/130     Pulse Rate 07/03/21 1337 89     Resp --      Temp 07/03/21 1337 (!) 97.3 F (36.3 C)     Temp Source 07/03/21 1337 Oral     SpO2 --      Weight 07/03/21 1339 67 kg (147 lb 12.8 oz)     Height 07/03/21 1343 1.803 m (5\' 11" )     Head Circumference --      Peak Flow --      Pain Score 07/03/21 1338 0     Pain Loc --      Pain Edu? --      Excl. in [place]? --     Most recent vital signs: Vitals:   07/03/21 1337  BP: (!) 193/130  Pulse: 89  Temp: (!) 97.3 F (36.3 C)     General: Awake, no distress.  CV:  Good peripheral perfusion.  Resp:  Normal effort.  Abd:  No distention.  Other:  Neuro: Cranial nerves appear intact, normal strength in the right upper extremity and right lower extremity   ED Results / Procedures / Treatments   Labs (all labs ordered are listed, but only abnormal results are displayed) Labs Reviewed  ETHANOL - Abnormal; Notable for the following components:      Result Value   Alcohol, Ethyl (B) 88 (*)    All other components within normal limits  COMPREHENSIVE METABOLIC PANEL - Abnormal; Notable for the following components:   Glucose, Bld 108  (*)    Total Protein 8.5 (*)    All other components within normal limits  URINALYSIS, ROUTINE W REFLEX MICROSCOPIC - Abnormal; Notable for the following components:   APPearance CLEAR (*)    Specific Gravity, Urine <1.005 (*)    All other components within normal limits  CBG MONITORING, ED - Abnormal; Notable for the following components:   Glucose-Capillary 119 (*)    All other components within normal limits  RESP PANEL BY RT-PCR (FLU A&B, COVID) ARPGX2  PROTIME-INR  APTT  CBC  DIFFERENTIAL  URINE DRUG SCREEN, QUALITATIVE ([facility] ONLY)     EKG  ED ECG REPORT I, Lavonia Drafts, the attending physician, personally viewed and interpreted this ECG.  Date: 07/03/2021  Rhythm: normal sinus rhythm QRS Axis: normal Intervals: normal ST/T Wave abnormalities: normal Narrative Interpretation: no evidence of acute ischemia    RADIOLOGY CT head reviewed by me, no acute abnormality    PROCEDURES:  Critical Care performed:   Procedures   MEDICATIONS ORDERED IN ED: Medications  labetalol (NORMODYNE) injection 10 mg (10 mg  Intravenous Given 07/03/21 1428)     IMPRESSION / MDM / ASSESSMENT AND PLAN / ED COURSE  I reviewed the triage vital signs and the nursing notes.  Patient presents with right arm tingling/numbness and some heaviness and a strange feeling in his right face.  No facial droop.  Strength in the right upper extremity appears normal.  Has a history of CVA  He is outside the window for [place] K given that he woke up with the symptoms at 7 AM this morning.  No evidence of LVO  Will send for CT head, obtain labs   ----------------------------------------- 2:11 PM on 07/03/2021 ----------------------------------------- CT scan is reassuring, lab work reviewed and is unremarkable.  Patient given IV labetalol for significant hypertension  Will consult hospitalist service for admission.  Discussed with hospitalist, they have requested neuro consultation, will  consult and speak with neuro            FINAL CLINICAL IMPRESSION(S) / ED DIAGNOSES   Final diagnoses:  Cerebrovascular accident (CVA), unspecified mechanism ([place])     Rx / DC Orders   ED Discharge Orders     None        Note:  This document was prepared using Dragon voice recognition software and may include unintentional dictation errors.   Lavonia Drafts, MD 07/03/21 1431

## 2021-07-03 NOTE — Plan of Care (Signed)

## 2021-07-03 NOTE — Assessment & Plan Note (Addendum)
Pt admits to using cocaine 3-4 days ago.  Urine drug screen positive for cocaine.  Avoid beta-blockers.

## 2021-07-03 NOTE — Assessment & Plan Note (Signed)
Admit to medical telemetry bed. Neurology consult. Check MRI brain. Check echo. Check UDS. Pt admits to using cocaine 3-4 days ago.

## 2021-07-03 NOTE — Assessment & Plan Note (Signed)
Check lipid panel  

## 2021-07-03 NOTE — ED Notes (Signed)
RN notified charge nurse that there had not been an assigned floor nurse to this bed at this time for pt room and handoff.

## 2021-07-04 LAB — HEMOGLOBIN A1C
Hgb A1c MFr Bld: 5.3 % (ref 4.8–5.6)
Mean Plasma Glucose: 105 mg/dL

## 2021-07-04 LAB — LIPID PANEL
Cholesterol: 201 mg/dL — ABNORMAL HIGH (ref 0–200)
HDL: 69 mg/dL (ref >40)
LDL Cholesterol: 123 mg/dL — ABNORMAL HIGH (ref 0–99)
Total CHOL/HDL Ratio: 2.9 RATIO
Triglycerides: 47 mg/dL (ref <150)
VLDL: 9 mg/dL (ref 0–40)

## 2021-07-04 LAB — HIV ANTIBODY (ROUTINE TESTING W REFLEX): HIV Screen 4th Generation wRfx: NONREACTIVE

## 2021-07-04 MED ORDER — ASPIRIN 325 MG PO TBEC: 325.0000 mg | DELAYED_RELEASE_TABLET | Freq: Every day | ORAL | 3 refills | Status: DC

## 2021-07-04 MED ORDER — ATORVASTATIN CALCIUM 40 MG PO TABS: 40.0000 mg | ORAL_TABLET | Freq: Every day | ORAL | 2 refills | Status: DC

## 2021-07-04 MED ORDER — ADULT MULTIVITAMIN W/MINERALS CH: 1.0000 | ORAL_TABLET | Freq: Every day | ORAL | 3 refills | Status: DC

## 2021-07-04 NOTE — Progress Notes (Signed)
SLP Cancellation Note  Patient Details Name: Robert Jacobson MRN: [id number] DOB: 1976-03-07   Cancelled treatment:       Reason Eval/Treat Not Completed: SLP screened, no needs identified, will sign off (chart reviewed; consulted NSG then met w/ pt in room) Pt denied any difficulty swallowing and is currently on a regular diet; tolerates swallowing pills w/ water per NSG. Pt conversed in conversation w/out overt expressive/receptive deficits noted; pt denied any speech-language deficits. Speech clear. Pt feeding himself his breakfast meal w/out deficits noted during this screening. No further skilled ST services indicated as pt appears at his baseline. Pt agreed. NSG to reconsult if any change in status while admitted.       Orinda Kenner, MS, CCC-SLP Speech Language Pathologist Re[facility] Services; [place] [phone] (ascom) Tristy Udovich 07/04/2021, 8:55 AM

## 2021-07-04 NOTE — Progress Notes (Signed)
Pt being discharged home, discharge instructions reviewed with pt, states understanding, pt with no complaints, refuses wheelchair for discharge

## 2021-07-04 NOTE — Progress Notes (Signed)
OT Cancellation Note  Patient Details Name: Robert Jacobson MRN: [id number] DOB: 10/26/1975   Cancelled Treatment:    Reason Eval/Treat Not Completed: OT screened, no needs identified, will sign off. Pt reports all symptoms have resolved and is at independent level. No further need for OT evaluation.   Jackquline Denmark, MS, OTR/L , CBIS ascom [phone]  07/04/21, 9:33 AM

## 2021-07-04 NOTE — Discharge Summary (Signed)
[facility] [place] [facility]   PATIENT NAME: Robert KaufmannChristopher Jacobson    MR#:  [id number]  DATE OF BIRTH:  [birth year]  DATE OF ADMISSION:  07/03/2021 ADMITTING PHYSICIAN: Carollee HerterEric Chen, DO  DATE OF DISCHARGE: 07/04/2021  PRIMARY CARE PHYSICIAN: Aura Dialsannady, Jolene T, NP    ADMISSION DIAGNOSIS:  TIA (transient ischemic attack) [G45.9] Cerebrovascular accident (CVA), unspecified mechanism (HCC) [I63.9]  DISCHARGE DIAGNOSIS:  TIA  SECONDARY DIAGNOSIS:   Past Medical History:  Diagnosis Date   Acute ischemic stroke (HCC) 03/18/2018   Hypertension    Stroke (HCC)    Subarachnoid hemorrhage (HCC) 04/01/2018   Noted during 03/28/18    HOSPITAL COURSE:   46 year old male with a history of hypertension not treated due to lack of funds and PCP, 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  Presents to the ER today with onset of tingling of his right temporal area of his face, right arm when he woke up around 7:30 AM. UDS positive for cocaine.  Alcohol level of 88. CT head demonstrated no acute intracranial normalities.  TIA (transient ischemic attack)   Neurology consulted Cont asa 325 mg qd and statins is very anxious to discharge from the hospital. -MRI brain no acute cva, sequela of old cvs --pt does not want to wait for Echo. Echo in 2019 noted--EF 60% - Pt admits to using cocaine 3-4 days ago--advised cessation -- patient denies any tingling numbness. --PT OT and speech therapy signed off since no needs. Patient says his back to his baseline --per RN pt wanted to leave AMA last pm.    Essential (primary) hypertension BP has been stable 110--120. Patient not on any BP meds at home. I have asked him to keep a log of his blood pressure at home and review with PCP once he establishes one.   Alcohol dependence (HCC) CIWA protocol. -scoring 0 today advised to abstain from heavy drinking   Cocaine abuse (HCC) Pt admits to  using cocaine 3-4 days ago.  Urine drug screen positive for cocaine.    Nicotine dependence, cigarettes, uncomplicated Nicotine patch.   Hyperlipidemia resumed atorvastatin  CONSULTS OBTAINED:    DRUG ALLERGIES:  No Known Allergies  DISCHARGE MEDICATIONS:   Allergies as of 07/04/2021   No Known Allergies      Medication List     TAKE these medications    aspirin 325 MG EC tablet Take 1 tablet (325 mg total) by mouth daily.   atorvastatin 40 MG tablet Commonly known as: LIPITOR Take 1 tablet (40 mg total) by mouth daily. What changed: See the new instructions.   multivitamin with minerals Tabs tablet Take 1 tablet by mouth daily. Start taking on: July 05, 2021        If you experience worsening of your admission symptoms, develop shortness of breath, life threatening emergency, suicidal or homicidal thoughts you must seek medical attention immediately by calling 911 or calling your MD immediately  if symptoms less severe.  You Must read complete instructions/literature along with all the possible adverse reactions/side effects for all the Medicines you take and that have been prescribed to you. Take any new Medicines after you have completely understood and accept all the possible adverse reactions/side effects.   Please note  You were cared for by a hospitalist during your hospital stay. If you have any questions about your discharge medications or the care you received while you were in the hospital after you are discharged,  you can call the unit and asked to speak with the hospitalist on call if the hospitalist that took care of you is not available. Once you are discharged, your primary care physician will handle any further medical issues. Please note that NO REFILLS for any discharge medications will be authorized once you are discharged, as it is imperative that you return to your primary care physician (or establish a relationship with a primary care physician  if you do not have one) for your aftercare needs so that they can reassess your need for medications and monitor your lab values. Today   SUBJECTIVE   I am feeling fine. No tingling numbness. I am ready to go out of the hospital.  VITAL SIGNS:  Blood pressure 116/79, pulse 77, temperature 97.9 F (36.6 C), resp. rate 18, height 5\' 11"  (1.803 m), weight 67 kg, SpO2 96 %.  I/O:  No intake or output data in the 24 hours ending 07/04/21 0947  PHYSICAL EXAMINATION:  GENERAL:  46 y.o.-year-old patient lying in the bed with no acute distress.  LUNGS: Normal breath sounds bilaterally, no wheezing, rales,rhonchi or crepitation.  CARDIOVASCULAR: S1, S2 normal. No murmurs, rubs, or gallops.  ABDOMEN: Soft, non-tender, non-distended. Bowel sounds present.  EXTREMITIES: No  clubbing.  NEUROLOGIC: non-focal, no neuro- deficit. Speech clear. PSYCHIATRIC:  patient is alert and awake  SKIN: No obvious rash, lesion, or ulcer.   DATA REVIEW:   CBC  Recent Labs  Lab 07/03/21 1345  WBC 7.6  HGB 15.7  HCT 47.1  PLT 309    Chemistries  Recent Labs  Lab 07/03/21 1345  NA 136  K 3.7  CL 102  CO2 24  GLUCOSE 108*  BUN 8  CREATININE 0.65  CALCIUM 9.3  AST 23  ALT 18  ALKPHOS 56  BILITOT 0.7    Microbiology Results   Recent Results (from the past 240 hour(s))  Resp Panel by RT-PCR (Flu A&B, Covid) Nasopharyngeal Swab     Status: None   Collection Time: 07/03/21  1:45 PM   Specimen: Nasopharyngeal Swab; Nasopharyngeal(NP) swabs in vial transport medium  Result Value Ref Range Status   SARS Coronavirus 2 by RT PCR NEGATIVE NEGATIVE Final    Comment: (NOTE) SARS-CoV-2 target nucleic acids are NOT DETECTED.  The SARS-CoV-2 RNA is generally detectable in upper respiratory specimens during the acute phase of infection. The lowest concentration of SARS-CoV-2 viral copies this assay can detect is 138 copies/mL. A negative result does not preclude SARS-Cov-2 infection and should not  be used as the sole basis for treatment or other patient management decisions. A negative result may occur with  improper specimen collection/handling, submission of specimen other than nasopharyngeal swab, presence of viral mutation(s) within the areas targeted by this assay, and inadequate number of viral copies(<138 copies/mL). A negative result must be combined with clinical observations, patient history, and epidemiological information. The expected result is Negative.  Fact Sheet for Patients:  07/05/21  Fact Sheet for Healthcare Providers:  BloggerCourse.com  This test is no t yet approved or cleared by the [place] FDA and  has been authorized for detection and/or diagnosis of SARS-CoV-2 by FDA under an Emergency Use Authorization (EUA). This EUA will remain  in effect (meaning this test can be used) for the duration of the COVID-19 declaration under Section 564(b)(1) of the Act, 21 U.S.C.section 360bbb-3(b)(1), unless the authorization is terminated  or revoked sooner.       Influenza A by PCR NEGATIVE NEGATIVE  Final   Influenza B by PCR NEGATIVE NEGATIVE Final    Comment: (NOTE) The Xpert Xpress SARS-CoV-2/FLU/RSV plus assay is intended as an aid in the diagnosis of influenza from Nasopharyngeal swab specimens and should not be used as a sole basis for treatment. Nasal washings and aspirates are unacceptable for Xpert Xpress SARS-CoV-2/FLU/RSV testing.  Fact Sheet for Patients: BloggerCourse.com  Fact Sheet for Healthcare Providers: SeriousBroker.it  This test is not yet approved or cleared by the [place] FDA and has been authorized for detection and/or diagnosis of SARS-CoV-2 by FDA under an Emergency Use Authorization (EUA). This EUA will remain in effect (meaning this test can be used) for the duration of the COVID-19 declaration under Section  564(b)(1) of the Act, 21 U.S.C. section 360bbb-3(b)(1), unless the authorization is terminated or revoked.  Performed at [facility], [address] [place], [place] [address]     RADIOLOGY:  CT HEAD WO CONTRAST  Result Date: 07/03/2021 CLINICAL DATA:  Right arm weakness and blurred vision since 7 a.m. today. EXAM: CT HEAD WITHOUT CONTRAST TECHNIQUE: Contiguous axial images were obtained from the base of the skull through the vertex without intravenous contrast. RADIATION DOSE REDUCTION: This exam was performed according to the departmental dose-optimization program which includes automated exposure control, adjustment of the mA and/or kV according to patient size and/or use of iterative reconstruction technique. COMPARISON:  03/30/2019 FINDINGS: Brain: Normal appearing cerebral hemispheres and posterior fossa structures. Normal size and position of the ventricles. No intracranial hemorrhage, mass lesion or CT evidence of acute infarction. Vascular: No hyperdense vessel or unexpected calcification. Skull: Normal. Negative for fracture or focal lesion. Sinuses/Orbits: Left sphenoid sinus retention cyst. Moderate mucosal thickening in the posterior aspects of both frontal sinuses. Unremarkable orbits. Other: Mild left concha bullosa. IMPRESSION: 1. No intracranial abnormality. 2. Bilateral frontal chronic sinusitis. Electronically Signed   By: Beckie Salts M.D.   On: 07/03/2021 14:03   MR BRAIN WO CONTRAST  Result Date: 07/03/2021 CLINICAL DATA:  Transient ischemic attack. Right facial tingling. Right arm paresthesias. History of hypertension. EXAM: MRI HEAD WITHOUT CONTRAST TECHNIQUE: Multiplanar, multiecho pulse sequences of the brain and surrounding structures were obtained without intravenous contrast. COMPARISON:  Head CT earlier same day. CT studies 03/30/2019. MRI 03/19/2018. FINDINGS: Brain: Diffusion imaging does not show any acute or subacute infarction. The brainstem and  cerebellum are normal. Left cerebral hemisphere is normal. There is chronic encephalomalacia of the right anterior inferior frontal lobe, presumably related to old head trauma. This was present in 2019. There is a single punctate focus of T2 and FLAIR signal within the right posterior frontal vertex white matter, sequela a white matter infarction or towards acute in October of 2019. The other acute infarction in the white matter of the right frontal operculum is not associated with any residual MR finding. No large vessel territory insult. No mass lesion, hemorrhage, hydrocephalus or extra-axial collection. Vascular: Major vessels at the base of the brain show flow. Previously seen right ICA dissection in 2019 has resolved/healed. Skull and upper cervical spine: Negative Sinuses/Orbits: No acute sinusitis. Small left maxillary sinus with mild mucosal thickening. Orbits negative. Other: None IMPRESSION: No acute finding. Single punctate focus of T2 and FLAIR signal within the right posterior frontal vertex white matter, sequela of an acute infarction in 2019. 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 Electronically Signed   By: Paulina Fusi M.D.   On: 07/03/2021 20:50  CODE STATUS:     Code Status Orders  (From admission, onward)           Start     Ordered   07/03/21 1745  Full code  Continuous        07/03/21 1744           Code Status History     Date Active Date Inactive Code Status Order ID Comments User Context   07/03/2021 1436 07/03/2021 1744 Full Code [id number]  Carollee HerterChen, Eric, DO ED   03/18/2018 1535 03/19/2018 1922 Full Code [id number]  Milon DikesArora, Ashish, MD Inpatient   03/18/2018 1522 03/18/2018 1528 Full Code [id number]  Arline AspWilliams, Jessica N, NP HOV        TOTAL TIME TAKING CARE OF THIS PATIENT: 35 minutes.    Enedina FinnerSona Alexsus Papadopoulos M.D  [facility]    CC: Primary care physician; Marjie Skiffannady, Jolene T,  NP

## 2021-07-04 NOTE — Discharge Instructions (Signed)
Pt advised to stop using cocaine/smoking and drinking alcohol

## 2021-07-04 NOTE — Progress Notes (Signed)
PT Cancellation Note  Patient Details Name: Robert Jacobson MRN: [id number] DOB: March 30, 1976   Cancelled Treatment:    Reason Eval/Treat Not Completed: PT screened, no needs identified, will sign off PT orders received, chart reviewed. Per OT & pt, pt is at [facility] with no acute needs. PT to sign off at this time.   Aleda Grana, PT, DPT 07/04/21, 9:44 AM   Sandi Mariscal 07/04/2021, 9:44 AM

## 2021-07-04 NOTE — Care Management (Signed)
°  Transition of Care [facility]) Screening Note   Patient Details  Name: Robert Jacobson Date of Birth: 12-24-1975   Transition of Care [facility]) CM/SW Contact:    Pete Pelt, RN Phone Number: 07/04/2021, 9:43 AM    Transition of Care Department [facility]) has reviewed patient and no TOC needs have been identified at this time. We will continue to monitor patient advancement through interdisciplinary progression rounds. If new patient transition needs arise, please place a TOC consult.

## 2022-12-22 ENCOUNTER — Encounter: Payer: Self-pay | Admitting: Emergency Medicine

## 2022-12-22 ENCOUNTER — Other Ambulatory Visit: Payer: Self-pay

## 2022-12-22 ENCOUNTER — Emergency Department: Payer: Self-pay

## 2022-12-22 ENCOUNTER — Emergency Department
Admission: EM | Admit: 2022-12-22 | Discharge: 2022-12-22 | Disposition: A | Discharge status: Home / Self Care | Payer: Self-pay | Attending: Emergency Medicine | Admitting: Emergency Medicine

## 2022-12-22 DIAGNOSIS — W3189XA Contact with other specified machinery, initial encounter: Secondary | ICD-10-CM | POA: Insufficient documentation

## 2022-12-22 DIAGNOSIS — S61421A Laceration with foreign body of right hand, initial encounter: Secondary | ICD-10-CM | POA: Insufficient documentation

## 2022-12-22 MED ORDER — NAPROXEN 500 MG PO TABS: 500.0000 mg | ORAL_TABLET | Freq: Two times a day (BID) | ORAL | 0 refills | Status: DC

## 2022-12-22 MED ORDER — CEPHALEXIN 500 MG PO CAPS: 500.0000 mg | ORAL_CAPSULE | Freq: Four times a day (QID) | ORAL | 0 refills | Status: AC

## 2022-12-22 MED ORDER — LIDOCAINE HCL (PF) 1 % IJ SOLN
5.0000 mL | Freq: Once | INTRAMUSCULAR | Status: AC
  Administered 2022-12-22: 5 mL via INTRADERMAL
  Filled 2022-12-22: qty 5

## 2022-12-22 MED ORDER — BACITRACIN ZINC 500 UNIT/GM EX OINT
TOPICAL_OINTMENT | Freq: Once | CUTANEOUS | Status: DC
  Filled 2022-12-22: qty 0.9

## 2022-12-22 MED ORDER — HYDROCODONE-ACETAMINOPHEN 5-325 MG PO TABS: 1.0000 | ORAL_TABLET | Freq: Four times a day (QID) | ORAL | 0 refills | Status: AC | PRN

## 2022-12-22 NOTE — ED Notes (Signed)
See triage notes. Patient was using a grinding wheel last night when the wheel shattered and went into his right hand. Patient believes he got all of the pieces out but wanted to make sure.

## 2022-12-22 NOTE — ED Triage Notes (Signed)
Patient to ED via POV for laceration to right hand. Patient states last PM a grinder wheel shattered onto hand. Bleeding controlled at this time.

## 2022-12-22 NOTE — ED Provider Notes (Signed)
[facility] [place] Provider Note    Event Date/Time   First MD Initiated Contact with Patient 12/22/22 [phone]     (approximate)   History   Laceration   HPI  Robert Jacobson is a 47 y.o. male with no significant past medical history and as listed in EMR presents to the emergency department for treatment and evaluation of laceration to the right hand that occurred last night while using a grinding wheel. He wrapped it up and got it to stop bleeding but was encouraged to come to the ER for evaluation. Tdap is current.       Physical Exam   Triage Vital Signs: ED Triage Vitals  Encounter Vitals Group     BP 12/22/22 0713 123/83     Systolic BP Percentile --      Diastolic BP Percentile --      Pulse Rate 12/22/22 0713 81     Resp 12/22/22 0713 18     Temp 12/22/22 0713 98.1 F (36.7 C)     Temp Source 12/22/22 0713 Oral     SpO2 12/22/22 0713 100 %     Weight 12/22/22 0721 147 lb 11.3 oz (67 kg)     Height 12/22/22 0721 5\' 11"  (1.803 m)     Head Circumference --      Peak Flow --      Pain Score 12/22/22 0712 8     Pain Loc --      Pain Education --      Exclude from Growth Chart --     Most recent vital signs: Vitals:   12/22/22 0713 12/22/22 1016  BP: 123/83 126/82  Pulse: 81 78  Resp: 18 18  Temp: 98.1 F (36.7 C) 98.1 F (36.7 C)  SpO2: 100% 100%    General: Awake, no distress.  CV:  Good peripheral perfusion.  Resp:  Normal effort.  Abd:  No distention.  Other:  5cm laceration to the dorsal aspect of the right hand over 4th metacarpal and just inside webbing of the 4th and 5th fingers.   ED Results / Procedures / Treatments   Labs (all labs ordered are listed, but only abnormal results are displayed) Labs Reviewed - No data to display   EKG  Not indicated.   RADIOLOGY  Image and radiology report reviewed and interpreted by me. Radiology report consistent with the same.  Radiopaque retained foreign body over the  proximal aspect of the right fifth proximal phalanx.  PROCEDURES:  Critical Care performed: No  ..Laceration Repair  Date/Time: 12/22/2022 12:34 PM  Performed by: Chinita Pester, FNP Authorized by: Chinita Pester, FNP   Consent:    Consent obtained:  Verbal   Consent given by:  Patient   Risks discussed:  Infection, pain, retained foreign body and tendon damage Universal protocol:    Procedure explained and questions answered to patient or proxy's satisfaction: yes     Patient identity confirmed:  Verbally with patient Anesthesia:    Anesthesia method:  Local infiltration   Local anesthetic:  Lidocaine 1% w/o epi Laceration details:    Location:  Hand   Hand location:  R hand, dorsum   Length (cm):  5 Pre-procedure details:    Preparation:  Patient was prepped and draped in usual sterile fashion Exploration:    Wound extent: foreign bodies/material     Foreign bodies/material:  Glass Treatment:    Area cleansed with:  Povidone-iodine and saline   Amount  of cleaning:  Extensive   Irrigation method:  Syringe (soak)   Debridement:  None Skin repair:    Repair method:  Sutures   Suture size:  4-0   Suture material:  Nylon   Suture technique:  Simple interrupted   Number of sutures:  3 Approximation:    Approximation:  Close Repair type:    Repair type:  Complex Post-procedure details:    Dressing:  Antibiotic ointment and sterile dressing   Procedure completion:  Tolerated well, no immediate complications Comments:     POCUS utilized for visualization of retained FB. Glass fragment removed intact. No additional FB noted in the area based on bedside read.    MEDICATIONS ORDERED IN ED:  Medications  bacitracin ointment (has no administration in time range)  lidocaine (PF) (XYLOCAINE) 1 % injection 5 mL (5 mLs Intradermal Given by Other 12/22/22 5621)     IMPRESSION / MDM / ASSESSMENT AND PLAN / ED COURSE   I have reviewed the triage note.  Differential  diagnosis includes, but is not limited to, skin laceration, ligament injury, retained foreign body  Patient's presentation is most consistent with acute illness / injury with system symptoms.  47 year old male presenting to the emergency department for treatment and evaluation of injury sustained to the right hand last night while helping a friend.  See HPI for further details.  X-ray of the hand reveals no acute bony abnormality however he does have retained foreign body noted in the area of the laceration between the webbing of the fourth and fifth finger.  Area cleaned and repaired as listed above.  Home wound care instructions discussed with the patient.  He was encouraged to go to urgent care in approximately 10 days for suture removal.  He was advised to be evaluated sooner if he has any sign or concern of infection.  Antibiotic and short course of pain medication submitted to patient's pharmacy.  Work excuse provided for the next few days as well.      FINAL CLINICAL IMPRESSION(S) / ED DIAGNOSES   Final diagnoses:  Laceration of right hand with foreign body, initial encounter     Rx / DC Orders   ED Discharge Orders          Ordered    cephALEXin (KEFLEX) 500 MG capsule  4 times daily        12/22/22 1004    HYDROcodone-acetaminophen (NORCO/VICODIN) 5-325 MG tablet  Every 6 hours PRN        12/22/22 1005    naproxen (NAPROSYN) 500 MG tablet  2 times daily with meals        12/22/22 1005             Note:  This document was prepared using Dragon voice recognition software and may include unintentional dictation errors.   Chinita Pester, FNP 12/22/22 1241    Jene Every, MD 12/22/22 [phone]

## 2022-12-22 NOTE — Discharge Instructions (Addendum)
Do not get the sutured area wet for 24 hours. After 24 hours, shower/bathe as usual and pat the area dry. °Change the bandage 2 times per day and apply antibiotic ointment. °Leave open to air when at no risk of getting the area dirty, but cover at night before bed. °See your PCP or go to Urgent Care in 10 days for suture removal or sooner for signs or concern of infection. ° °

## 2023-06-25 ENCOUNTER — Encounter: Payer: Self-pay | Admitting: Internal Medicine

## 2023-06-25 ENCOUNTER — Emergency Department: Payer: Self-pay

## 2023-06-25 ENCOUNTER — Other Ambulatory Visit: Payer: Self-pay

## 2023-06-25 ENCOUNTER — Inpatient Hospital Stay
Admission: EM | Admit: 2023-06-25 | Discharge: 2023-06-26 | DRG: 124 | Disposition: A | Discharge status: Home / Self Care | Payer: Self-pay | Attending: Obstetrics and Gynecology | Admitting: Obstetrics and Gynecology

## 2023-06-25 ENCOUNTER — Inpatient Hospital Stay: Payer: Self-pay

## 2023-06-25 ENCOUNTER — Inpatient Hospital Stay
Admit: 2023-06-25 | Discharge: 2023-06-25 | Disposition: A | Discharge status: Home / Self Care | Payer: Self-pay | Attending: Family Medicine | Admitting: Family Medicine

## 2023-06-25 DIAGNOSIS — R29703 NIHSS score 3: Secondary | ICD-10-CM | POA: Diagnosis present

## 2023-06-25 DIAGNOSIS — F1010 Alcohol abuse, uncomplicated: Secondary | ICD-10-CM

## 2023-06-25 DIAGNOSIS — F1410 Cocaine abuse, uncomplicated: Secondary | ICD-10-CM | POA: Diagnosis present

## 2023-06-25 DIAGNOSIS — Z91199 Patient's noncompliance with other medical treatment and regimen due to unspecified reason: Secondary | ICD-10-CM

## 2023-06-25 DIAGNOSIS — Z5971 Insufficient health insurance coverage: Secondary | ICD-10-CM

## 2023-06-25 DIAGNOSIS — Z79899 Other long term (current) drug therapy: Secondary | ICD-10-CM

## 2023-06-25 DIAGNOSIS — F1290 Cannabis use, unspecified, uncomplicated: Secondary | ICD-10-CM | POA: Diagnosis present

## 2023-06-25 DIAGNOSIS — Z720 Tobacco use: Secondary | ICD-10-CM

## 2023-06-25 DIAGNOSIS — I639 Cerebral infarction, unspecified: Secondary | ICD-10-CM | POA: Diagnosis present

## 2023-06-25 DIAGNOSIS — Z7982 Long term (current) use of aspirin: Secondary | ICD-10-CM

## 2023-06-25 DIAGNOSIS — S27392A Other injuries of lung, bilateral, initial encounter: Secondary | ICD-10-CM | POA: Diagnosis present

## 2023-06-25 DIAGNOSIS — Z8249 Family history of ischemic heart disease and other diseases of the circulatory system: Secondary | ICD-10-CM

## 2023-06-25 DIAGNOSIS — F1910 Other psychoactive substance abuse, uncomplicated: Secondary | ICD-10-CM

## 2023-06-25 DIAGNOSIS — F17210 Nicotine dependence, cigarettes, uncomplicated: Secondary | ICD-10-CM | POA: Diagnosis present

## 2023-06-25 DIAGNOSIS — X58XXXA Exposure to other specified factors, initial encounter: Secondary | ICD-10-CM | POA: Diagnosis present

## 2023-06-25 DIAGNOSIS — H538 Other visual disturbances: Principal | ICD-10-CM | POA: Diagnosis present

## 2023-06-25 DIAGNOSIS — I10 Essential (primary) hypertension: Secondary | ICD-10-CM | POA: Diagnosis present

## 2023-06-25 DIAGNOSIS — Z8673 Personal history of transient ischemic attack (TIA), and cerebral infarction without residual deficits: Secondary | ICD-10-CM

## 2023-06-25 DIAGNOSIS — R202 Paresthesia of skin: Principal | ICD-10-CM

## 2023-06-25 DIAGNOSIS — J189 Pneumonia, unspecified organism: Secondary | ICD-10-CM | POA: Diagnosis present

## 2023-06-25 DIAGNOSIS — Y903 Blood alcohol level of 60-79 mg/100 ml: Secondary | ICD-10-CM | POA: Diagnosis present

## 2023-06-25 DIAGNOSIS — R200 Anesthesia of skin: Principal | ICD-10-CM

## 2023-06-25 DIAGNOSIS — E785 Hyperlipidemia, unspecified: Secondary | ICD-10-CM | POA: Diagnosis present

## 2023-06-25 DIAGNOSIS — Z823 Family history of stroke: Secondary | ICD-10-CM

## 2023-06-25 DIAGNOSIS — F1020 Alcohol dependence, uncomplicated: Secondary | ICD-10-CM | POA: Diagnosis present

## 2023-06-25 LAB — COMPREHENSIVE METABOLIC PANEL WITH GFR
ALT: 21 U/L (ref 0–44)
AST: 35 U/L (ref 15–41)
Albumin: 4.4 g/dL (ref 3.5–5.0)
Alkaline Phosphatase: 56 U/L (ref 38–126)
Anion gap: 15 (ref 5–15)
BUN: 13 mg/dL (ref 6–20)
CO2: 22 mmol/L (ref 22–32)
Calcium: 9.1 mg/dL (ref 8.9–10.3)
Chloride: 100 mmol/L (ref 98–111)
Creatinine, Ser: 0.85 mg/dL (ref 0.61–1.24)
GFR, Estimated: >60 mL/min
Glucose, Bld: 82 mg/dL (ref 70–99)
Potassium: 4.1 mmol/L (ref 3.5–5.1)
Sodium: 137 mmol/L (ref 135–145)
Total Bilirubin: 0.5 mg/dL (ref 0.0–1.2)
Total Protein: 8.2 g/dL — ABNORMAL HIGH (ref 6.5–8.1)

## 2023-06-25 LAB — CBG MONITORING, ED
Glucose-Capillary: 68 mg/dL — ABNORMAL LOW (ref 70–99)
Glucose-Capillary: 80 mg/dL (ref 70–99)

## 2023-06-25 LAB — CBC
HCT: 44.4 % (ref 39.0–52.0)
Hemoglobin: 15.1 g/dL (ref 13.0–17.0)
MCH: 32.3 pg (ref 26.0–34.0)
MCHC: 34 g/dL (ref 30.0–36.0)
MCV: 94.9 fL (ref 80.0–100.0)
Platelets: 311 10*3/uL (ref 150–400)
RBC: 4.68 MIL/uL (ref 4.22–5.81)
RDW: 14 % (ref 11.5–15.5)
WBC: 16.1 10*3/uL — ABNORMAL HIGH (ref 4.0–10.5)
nRBC: 0 % (ref 0.0–0.2)

## 2023-06-25 LAB — DIFFERENTIAL
Abs Immature Granulocytes: 0.06 K/uL (ref 0.00–0.07)
Basophils Absolute: 0.1 K/uL (ref 0.0–0.1)
Basophils Relative: 0 %
Eosinophils Absolute: 0.2 K/uL (ref 0.0–0.5)
Eosinophils Relative: 1 %
Immature Granulocytes: 0 %
Lymphocytes Relative: 11 %
Lymphs Abs: 1.8 K/uL (ref 0.7–4.0)
Monocytes Absolute: 0.6 K/uL (ref 0.1–1.0)
Monocytes Relative: 4 %
Neutro Abs: 13.4 K/uL — ABNORMAL HIGH (ref 1.7–7.7)
Neutrophils Relative %: 84 %

## 2023-06-25 LAB — URINE DRUG SCREEN, QUALITATIVE (ARMC ONLY)
Amphetamines, Ur Screen: NOT DETECTED
Barbiturates, Ur Screen: NOT DETECTED
Benzodiazepine, Ur Scrn: NOT DETECTED
Cannabinoid 50 Ng, Ur ~~LOC~~: NOT DETECTED
Cocaine Metabolite,Ur ~~LOC~~: POSITIVE — AB
MDMA (Ecstasy)Ur Screen: NOT DETECTED
Methadone Scn, Ur: NOT DETECTED
Opiate, Ur Screen: NOT DETECTED
Phencyclidine (PCP) Ur S: NOT DETECTED
Tricyclic, Ur Screen: NOT DETECTED

## 2023-06-25 LAB — ECHOCARDIOGRAM COMPLETE
Height: 69 in
Weight: 2400 [oz_av]

## 2023-06-25 LAB — TROPONIN I (HIGH SENSITIVITY): Troponin I (High Sensitivity): 11 ng/L

## 2023-06-25 LAB — HEMOGLOBIN A1C
Hgb A1c MFr Bld: 5.2 % (ref 4.8–5.6)
Mean Plasma Glucose: 102.54 mg/dL

## 2023-06-25 LAB — PROTIME-INR
INR: 1 (ref 0.8–1.2)
Prothrombin Time: 13 s (ref 11.4–15.2)

## 2023-06-25 LAB — ETHANOL: Alcohol, Ethyl (B): 71 mg/dL — ABNORMAL HIGH (ref <10)

## 2023-06-25 LAB — APTT: aPTT: 29 s (ref 24–36)

## 2023-06-25 MED ORDER — ENOXAPARIN SODIUM 40 MG/0.4ML IJ SOSY
40.0000 mg | PREFILLED_SYRINGE | INTRAMUSCULAR | Status: DC
Start: 2023-06-25 — End: 2023-06-26
  Administered 2023-06-25 – 2023-06-26 (×2): 40 mg via SUBCUTANEOUS
  Filled 2023-06-25 (×2): qty 0.4

## 2023-06-25 MED ORDER — ADULT MULTIVITAMIN W/MINERALS CH
1.0000 | ORAL_TABLET | Freq: Every day | ORAL | Status: DC
  Administered 2023-06-25 – 2023-06-26 (×2): 1 via ORAL
  Filled 2023-06-25 (×2): qty 1

## 2023-06-25 MED ORDER — ACETAMINOPHEN 325 MG PO TABS: 650.0000 mg | ORAL_TABLET | ORAL | Status: DC | PRN

## 2023-06-25 MED ORDER — ACETAMINOPHEN 160 MG/5ML PO SOLN: 650.0000 mg | ORAL | Status: DC | PRN

## 2023-06-25 MED ORDER — LORAZEPAM 1 MG PO TABS: 1.0000 mg | ORAL_TABLET | ORAL | Status: DC | PRN

## 2023-06-25 MED ORDER — ATORVASTATIN CALCIUM 20 MG PO TABS
40.0000 mg | ORAL_TABLET | Freq: Every day | ORAL | Status: DC
Start: 2023-06-25 — End: 2023-06-26
  Administered 2023-06-25 – 2023-06-26 (×2): 40 mg via ORAL
  Filled 2023-06-25 (×2): qty 2

## 2023-06-25 MED ORDER — FOLIC ACID 1 MG PO TABS
1.0000 mg | ORAL_TABLET | Freq: Every day | ORAL | Status: DC
  Administered 2023-06-25 – 2023-06-26 (×2): 1 mg via ORAL
  Filled 2023-06-25 (×2): qty 1

## 2023-06-25 MED ORDER — IOHEXOL 350 MG/ML SOLN
75.0000 mL | Freq: Once | INTRAVENOUS | Status: AC | PRN
  Administered 2023-06-25: 75 mL via INTRAVENOUS

## 2023-06-25 MED ORDER — SENNOSIDES-DOCUSATE SODIUM 8.6-50 MG PO TABS: 1.0000 | ORAL_TABLET | Freq: Every evening | ORAL | Status: DC | PRN

## 2023-06-25 MED ORDER — SODIUM CHLORIDE 0.9 % IV SOLN
500.0000 mg | Freq: Once | INTRAVENOUS | Status: AC
  Administered 2023-06-25: 500 mg via INTRAVENOUS
  Filled 2023-06-25: qty 5

## 2023-06-25 MED ORDER — SODIUM CHLORIDE 0.9 % IV SOLN: INTRAVENOUS | Status: DC

## 2023-06-25 MED ORDER — NICOTINE 21 MG/24HR TD PT24
21.0000 mg | MEDICATED_PATCH | Freq: Every day | TRANSDERMAL | Status: DC
  Administered 2023-06-25 – 2023-06-26 (×2): 21 mg via TRANSDERMAL
  Filled 2023-06-25 (×2): qty 1

## 2023-06-25 MED ORDER — STROKE: EARLY STAGES OF RECOVERY BOOK
Freq: Once | Status: AC
Start: 2023-06-26 — End: 2023-06-26

## 2023-06-25 MED ORDER — ACETAMINOPHEN 650 MG RE SUPP: 650.0000 mg | RECTAL | Status: DC | PRN

## 2023-06-25 MED ORDER — CEFTRIAXONE SODIUM 1 G IJ SOLR
1.0000 g | Freq: Once | INTRAMUSCULAR | Status: AC
  Administered 2023-06-25: 1 g via INTRAVENOUS
  Filled 2023-06-25: qty 10

## 2023-06-25 MED ORDER — THIAMINE MONONITRATE 100 MG PO TABS
100.0000 mg | ORAL_TABLET | Freq: Every day | ORAL | Status: DC
Start: 2023-06-25 — End: 2023-06-26
  Administered 2023-06-25 – 2023-06-26 (×2): 100 mg via ORAL
  Filled 2023-06-25 (×2): qty 1

## 2023-06-25 MED ORDER — THIAMINE HCL 100 MG/ML IJ SOLN: 100.0000 mg | Freq: Every day | INTRAMUSCULAR | Status: DC

## 2023-06-25 MED ORDER — SODIUM CHLORIDE 0.9 % IV SOLN
2.0000 g | INTRAVENOUS | Status: DC
  Administered 2023-06-26: 2 g via INTRAVENOUS
  Filled 2023-06-25: qty 20

## 2023-06-25 MED ORDER — LORAZEPAM 2 MG/ML IJ SOLN: 1.0000 mg | INTRAMUSCULAR | Status: DC | PRN

## 2023-06-25 MED ORDER — LABETALOL HCL 5 MG/ML IV SOLN: 10.0000 mg | INTRAVENOUS | Status: DC | PRN

## 2023-06-25 MED ORDER — SODIUM CHLORIDE 0.9 % IV SOLN
500.0000 mg | INTRAVENOUS | Status: DC
  Administered 2023-06-26: 500 mg via INTRAVENOUS
  Filled 2023-06-25: qty 5

## 2023-06-25 MED ORDER — PREDNISONE 50 MG PO TABS
50.0000 mg | ORAL_TABLET | Freq: Every day | ORAL | Status: DC
  Administered 2023-06-25 – 2023-06-26 (×2): 50 mg via ORAL
  Filled 2023-06-25: qty 3
  Filled 2023-06-25: qty 1

## 2023-06-25 MED ORDER — ASPIRIN 81 MG PO TBEC
81.0000 mg | DELAYED_RELEASE_TABLET | Freq: Every day | ORAL | Status: DC
  Administered 2023-06-25 – 2023-06-26 (×2): 81 mg via ORAL
  Filled 2023-06-25 (×2): qty 1

## 2023-06-25 NOTE — Plan of Care (Signed)
  Problem: Education: Goal: Knowledge of disease or condition will improve Outcome: Progressing   Problem: Ischemic Stroke/TIA Tissue Perfusion: Goal: Complications of ischemic stroke/TIA will be minimized Outcome: Progressing   Problem: Coping: Goal: Will verbalize positive feelings about self Outcome: Progressing   Problem: Health Behavior/Discharge Planning: Goal: Ability to manage health-related needs will improve Outcome: Progressing   Problem: Self-Care: Goal: Ability to participate in self-care as condition permits will improve Outcome: Progressing   Problem: Education: Goal: Knowledge of General Education information will improve Description: Including pain rating scale, medication(s)/side effects and non-pharmacologic comfort measures Outcome: Progressing   Problem: Health Behavior/Discharge Planning: Goal: Ability to manage health-related needs will improve Outcome: Progressing   Problem: Activity: Goal: Risk for activity intolerance will decrease Outcome: Progressing   Problem: Nutrition: Goal: Adequate nutrition will be maintained Outcome: Progressing   Problem: Coping: Goal: Level of anxiety will decrease Outcome: Progressing   Problem: Elimination: Goal: Will not experience complications related to bowel motility Outcome: Progressing   Problem: Safety: Goal: Ability to remain free from injury will improve Outcome: Progressing   Problem: Skin Integrity: Goal: Risk for impaired skin integrity will decrease Outcome: Progressing   Problem: Activity: Goal: Ability to tolerate increased activity will improve Outcome: Progressing   Problem: Respiratory: Goal: Ability to maintain adequate ventilation will improve Outcome: Progressing

## 2023-06-25 NOTE — Evaluation (Signed)
Clinical/Bedside Swallow Evaluation Patient Details  Name: Robert Jacobson MRN: [id number] Date of Birth: 1975-08-29  Today's Date: 06/25/2023 Time: SLP Start Time (ACUTE ONLY): 1208 SLP Stop Time (ACUTE ONLY): 1221 SLP Time Calculation (min) (ACUTE ONLY): 13 min  Past Medical History:  Past Medical History:  Diagnosis Date   Acute ischemic stroke (HCC) 03/18/2018   Hypertension    Stroke (HCC)    Subarachnoid hemorrhage (HCC) 04/01/2018   Noted during 03/28/18   Past Surgical History:  Past Surgical History:  Procedure Laterality Date   IR ANGIO INTRA EXTRACRAN SEL COM CAROTID INNOMINATE BILAT MOD SED  03/18/2018   IR ANGIO VERTEBRAL SEL VERTEBRAL BILAT MOD SED  03/18/2018   RADIOLOGY WITH ANESTHESIA N/A 03/18/2018   Procedure: IR WITH ANESTHESIA;  Surgeon: Radiologist, Medication, MD;  Location: [facility] OR;  Service: Radiology;  Laterality: N/A;   HPI:  Robert Jacobson is a 48 y.o. male with history of HTN, CVA, polysubstance abuse presenting today for blurred vision.  Patient states at 10:30 PM he had onset of blurred vision in his left eye.  He also started noticing sensation deficits to the left side of his face.  Denies any other numbness or weakness elsewhere in his body.  No speech changes.  Patient does have prior histories of CVA with uncontrolled hypertension.  Has not been on any medication for the past year.  Also states he relapsed on cocaine yesterday.  Endorses daily alcohol and marijuana use as well. MRI on 06/25/2023 revealed 1. No acute intracranial abnormality. 2. 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    Assessment / Plan / Recommendation  Clinical Impression  Per chart, pt currently NPO d/t failed [facility] Swallow Screen. Order requested. During this evaluation, while pt stopped consuming thin liquids prior to consuming 8 ounces, he was free of any overt s/s  of aspiration. In addition, his pharyngeal response appeared swift with no change in vocal quality or vitals. He doe snot endorse any history of dysphagia or aspiration. Pt also consumed applesauce and graham crackers without any s/s concerning for increased aspiration. At this time, will recommend regular diet with thin liquids (via cup or straw) and medicine whole with thin liquids. NO gross oral motor weakness noted either. Speech intelligibility appropriate. SLP Visit Diagnosis: Dysphagia, unspecified (R13.10)    Aspiration Risk  No limitations    Diet Recommendation Regular;Thin liquid    Liquid Administration via: Cup;Straw Medication Administration: Whole meds with liquid Supervision: Patient able to self feed Compensations: Minimize environmental distractions;Slow rate;Small sips/bites Postural Changes: Seated upright at 90 degrees;Remain upright for at least 30 minutes after po intake    Other  Recommendations Oral Care Recommendations: Oral care BID    Recommendations for follow up therapy are one component of a multi-disciplinary discharge planning process, led by the attending physician.  Recommendations may be updated based on patient status, additional functional criteria and insurance authorization.  Follow up Recommendations No SLP follow up      Assistance Recommended at Discharge  N/A  Functional Status Assessment Patient has not had a recent decline in their functional status  Frequency and Duration   N/A         Prognosis   N/A     Swallow Study   General Date of Onset: 06/25/23 HPI: Robert Jacobson is a 48 y.o. male with history of HTN, CVA, polysubstance abuse presenting today for blurred vision.  Patient states at 10:30 PM he had onset of blurred vision in his left eye.  He also started noticing sensation deficits to the left side of his face.  Denies any other numbness or weakness elsewhere in his body.  No speech changes.  Patient does have prior  histories of CVA with uncontrolled hypertension.  Has not been on any medication for the past year.  Also states he relapsed on cocaine yesterday.  Endorses daily alcohol and marijuana use as well. MRI on 06/25/2023 revealed 1. No acute intracranial abnormality. 2. 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 Type of Study: Bedside Swallow Evaluation Previous Swallow Assessment: none in chart Diet Prior to this Study: NPO Temperature Spikes Noted: No Respiratory Status: Room air History of Recent Intubation: No Behavior/Cognition: Alert;Cooperative;Pleasant mood Oral Cavity Assessment: Within Functional Limits Oral Care Completed by SLP: No Oral Cavity - Dentition: Adequate natural dentition Vision: Functional for self-feeding Self-Feeding Abilities: Able to feed self Patient Positioning:  (upright in stretcher) Baseline Vocal Quality: Normal Volitional Cough: Strong Volitional Swallow: Able to elicit    Oral/Motor/Sensory Function Overall Oral Motor/Sensory Function: Within functional limits   Ice Chips Ice chips: Not tested   Thin Liquid Thin Liquid: Within functional limits Presentation: Self Fed;Straw    Nectar Thick Nectar Thick Liquid: Not tested   Honey Thick Honey Thick Liquid: Not tested   Puree Puree: Within functional limits Presentation: Spoon   Solid     Solid: Within functional limits Presentation: Self Fed     Sly Parlee B. Dreama Saa, M.S., CCC-SLP, Tree surgeon Certified Brain Injury Specialist [facility]  [facility] Office [phone] Ascom [phone] Fax [phone]

## 2023-06-25 NOTE — Progress Notes (Signed)
OT Screen Note  Patient Details Name: SHREYANSH CLEMENSON MRN: [id number] DOB: 08-29-1975   Cancelled Treatment:    Reason Eval/Treat Not Completed: OT screened, no needs identified, will sign off. Consult received, chart reviewed. Per PT, pt at baseline and independent. No skilled OT needs identified. Will sign off.   Arman Filter., MPH, MS, OTR/L ascom [phone] 06/25/23, 1:06 PM

## 2023-06-25 NOTE — ED Triage Notes (Addendum)
Patient coming from home where he called EMS for having blurry vision. Hx of a stroke 2 years ago with no deficits per EMS. EMS stated also has a hx of hypertension but is noncompliant due to insurance reasons.  Bed bugs was also reported but does not seem present at this time. EMS vitals:  BP 234/142 and 184/132 HR 97 CBG 75 100% on RA

## 2023-06-25 NOTE — Progress Notes (Signed)
PT Cancellation Note  Patient Details Name: Robert Jacobson MRN: [id number] DOB: Feb 11, 1976   Cancelled Treatment:    Reason Eval/Treat Not Completed: PT screened, no needs identified, will sign off. Orders received and chart reviewed. Pt independent with bed mobility, dressing, and ambulation at 3.33'/sec well indicative of community ambulation speeds. Pt states he is at baseline. No acute PT needs. PT to sign off.    Delphia Grates. Fairly IV, PT, DPT Physical Therapist- [place]  [facility]  06/25/2023, 1:42 PM

## 2023-06-25 NOTE — Progress Notes (Signed)
SLP Cancellation Note  Patient Details Name: Robert Jacobson MRN: [id number] DOB: February 26, 1976   Cancelled treatment:       Reason Eval/Treat Not Completed: SLP screened, no needs identified, will sign off  Will discharge order for cognitive linguistic evaluation. Pt screened with no acute needs identified.   Torin Modica 06/25/2023, 1:28 PM

## 2023-06-25 NOTE — Progress Notes (Signed)
2956 - stroke alert activated for this patient with a LKWT of 2200. C/o left facial numbness, left arm and left leg weakness, headache, and left eye is blurry. Denies thinners. Patient has already been to and from CT.   0215 - TSMD page sent.   0228 - TSMD Benzion on cart to see the patient. Report given. NCCT Head negative read given.   39 - TSMD off cart and to call EDP with recommendations. TSRN on cart until patient goes to CTA.   0243 - TSRN off cart and monitoring imaging. Patient to CT for CTA.   0311 - Dr. Carolee Rota updated with negative CTA read.

## 2023-06-25 NOTE — Assessment & Plan Note (Signed)
Lipid panel pending  Resume statin

## 2023-06-25 NOTE — Consult Note (Addendum)
[facility] Tele[facility]urology Consult Services   Patient Name:   Robert Jacobson, Robert Jacobson Date of Birth:   1976/05/31 Date of Service:   06/25/2023 02:15:52  Diagnosis:       I63.89 - Cerebrovascular accident (CVA) due to other mechanism [facility])  Impression:      Patient presents the hospital for left-sided sensorimotor symptoms. Differential includes recrudescence of previous stroke symptoms versus new right hemispheric stroke. No thrombolysis considering history of SAH and outside the window. Will proceed with a repeat CTA head and neck considering his history of right ICA dissection and occlusion to make sure there has not been reocclusion. If that is negative however recommend admit for stroke workup. Recommendations relayed to ER physician over the phone.  Advanced Imaging:  CTA Head and Neck Completed.  LVO:No  Our recommendations are outlined below.  Recommendations:        Stroke/Telemetry Floor       Neuro Checks       Bedside Swallow Eval       DVT Prophylaxis       IV Fluids, Normal Saline       Head of Bed 30 Degrees       Euglycemia and Avoid Hyperthermia (PRN Acetaminophen)       Toxic/metabolic/infx workup per ED including UA, UDS       MRI brain without IV contrast       If no cerebral blood vessel imaging done in ED (such as CTA), recommend MRA head/neck without contrast, or carotid ultrasound if cannot obtain MRA       TSH, A1c, lipid profile       Transthoracic echocardiogram       Continuous telemetry       Physical, occupational, and speech therapies       q4h neuro checks/NIHSS       NPO until bedside swallow       Neurology follow-up  Sign Out:       Discussed with Emergency Department Provider    ------------------------------------------------------------------------------  Advanced Imaging: Advanced imaging has been ordered. Results pending.   Metrics: Last Known Well: 06/24/2023 22:00:00 Dispatch Time: 06/25/2023  02:15:52 Arrival Time: 06/25/2023 01:19:00 Initial Response Time: 06/25/2023 02:26:36 Symptoms: HA, numbness. Initial patient interaction: 06/25/2023 02:30:56 NIHSS Assessment Completed: 06/25/2023 02:39:59 Patient is not a candidate for Thrombolytic. Thrombolytic Medical Decision: 06/25/2023 02:39:59 Patient was not deemed candidate for Thrombolytic because of following reasons: LKW outside 4.5 hr window. . History of previous intracranial hemorrhage, intracranial neoplasm .  I personally Reviewed the CT Head and it Showed no acute abnormalities  Primary Provider Notified of Diagnostic Impression and Management Plan on: 06/25/2023 02:47:40    ------------------------------------------------------------------------------  History of Present Illness: Patient is a 48 year old Male.  Patient was brought by EMS for symptoms of HA, numbness. This is a 48 year old male with a history of previous CVA, previous right hemispheric subarachnoid hemorrhage, previous right ICA dissection and occlusion with good healing on his most recent CTA head and neck and October 2020. Presents the hospital for left-sided sensorimotor symptoms of headache and blurry vision in the left eye that started approximately 10 PM this evening. He does also have a headache, frontal, associate with photophobia. Denies history of migraine. Reports current headache is about a 5 out of 10 in severity. CT head without contrast in the ER showed no acute findings.   Past Medical History: Other PMH:  See HPI Above  Medications:  No Anticoagulant use  No Antiplatelet use Reviewed  EMR for current medications  Allergies:  Reviewed  Social History: Drug Use: No  Family History:  There is no family history of premature cerebrovascular disease pertinent to this consultation  ROS : 14 Points Review of Systems was performed and was negative except mentioned in HPI.  Past Surgical History: There Is No Surgical History  Contributory To Today's Visit    Examination: BP(168/111), Pulse(90), Blood Glucose(68) 1A: Level of Consciousness - Alert; keenly responsive + 0 1B: Ask Month and Age - Both Questions Right + 0 1C: Blink Eyes & Squeeze Hands - Performs Both Tasks + 0 2: Test Horizontal Extraocular Movements - Normal + 0 3: Test Visual Fields - No Visual Loss + 0 4: Test Facial Palsy (Use Grimace if Obtunded) - Normal symmetry + 0 5A: Test Left Arm Motor Drift - Drift, but doesn't hit bed + 1 5B: Test Right Arm Motor Drift - No Drift for 10 Seconds + 0 6A: Test Left Leg Motor Drift - Drift, but doesn't hit bed + 1 6B: Test Right Leg Motor Drift - No Drift for 5 Seconds + 0 7: Test Limb Ataxia (FNF/Heel-Shin) - No Ataxia + 0 8: Test Sensation - Mild-Moderate Loss: Less Sharp/More Dull + 1 9: Test Language/Aphasia - Normal; No aphasia + 0 10: Test Dysarthria - Normal + 0 11: Test Extinction/Inattention - No abnormality + 0  NIHSS Score: 3   Pre-Morbid Modified Rankin Scale: 0 Points = No symptoms at all  Spoke with : .  This consult was conducted in real time using interactive audio and Immunologist. Patient was informed of the technology being used for this visit and agreed to proceed. Patient located in hospital and provider located at home/office setting.   Patient is being evaluated for possible acute neurologic impairment and high probability of imminent or life-threatening deterioration. I spent total of 35 minutes providing care to this patient, including time for face to face visit via telemedicine, review of medical records, imaging studies and discussion of findings with providers, the patient and/or family.   Dr Lacie Scotts   [facility] For Inpatient follow-up with [facility] physician please call [facility] at [phone]. As we are not an outpatient service for any post hospital discharge needs please contact the hospital for assistance. If you have any questions for  the [facility] physicians or need to reconsult for clinical or diagnostic changes please contact us via [facility] at [phone].

## 2023-06-25 NOTE — ED Notes (Signed)
Called Code Stroke to [place] spoke with [place]

## 2023-06-25 NOTE — ED Provider Notes (Signed)
[facility] Provider Note    Event Date/Time   First MD Initiated Contact with Patient 06/25/23 0122     (approximate)   History   Blurred Vision and Hypertension   HPI CHADEN BATTIS is a 48 y.o. male with history of HTN, CVA, polysubstance abuse presenting today for blurred vision.  Patient states at 10:30 PM he had onset of blurred vision in his left eye.  He also started noticing sensation deficits to the left side of his face.  Denies any other numbness or weakness elsewhere in his body.  No speech changes.  Patient does have prior histories of CVA with uncontrolled hypertension.  Has not been on any medication for the past year.  Also states he relapsed on cocaine yesterday.  Endorses daily alcohol and marijuana use as well.  Reviewed prior chart notes including admissions for CVAs.     Physical Exam   Triage Vital Signs: ED Triage Vitals  Encounter Vitals Group     BP 06/25/23 0136 (!) 168/111     Systolic BP Percentile --      Diastolic BP Percentile --      Pulse Rate 06/25/23 0136 90     Resp 06/25/23 0136 18     Temp 06/25/23 0136 98.2 F (36.8 C)     Temp Source 06/25/23 0136 Oral     SpO2 06/25/23 0132 100 %     Weight 06/25/23 0130 150 lb (68 kg)     Height 06/25/23 0130 5\' 9"  (1.753 m)     Head Circumference --      Peak Flow --      Pain Score 06/25/23 0130 0     Pain Loc --      Pain Education --      Exclude from Growth Chart --     Most recent vital signs: Vitals:   06/25/23 0136 06/25/23 0330  BP: (!) 168/111 (!) 135/98  Pulse: 90 85  Resp: 18   Temp: 98.2 F (36.8 C)   SpO2: 97%    I have reviewed the vital signs. General:  Awake, alert, no acute distress. Head:  Normocephalic, Atraumatic. EENT:  PERRL, EOMI, Oral mucosa pink and moist, Neck is supple. Cardiovascular: Regular rate, 2+ distal pulses. Respiratory:  Normal respiratory effort, symmetrical expansion, no distress.   Extremities:  Moving all  four extremities through full ROM without pain.   Neuro:  Alert and oriented.  Interacting appropriately.  Blurred vision on left side in comparison to right side.  Perceived sensation diminished along the left side of his face compared to the right.  No speech changes.  Aside from those facial changes, no other deficits to cranial nerves II through XII.  5 out of 5 strength intact of bilateral upper and lower extremities.  Sensation equal intact bilateral upper and lower extremities.  No ataxia.  No dysmetria. Skin:  Warm, dry, no rash.   Psych: Appropriate affect.    ED Results / Procedures / Treatments   Labs (all labs ordered are listed, but only abnormal results are displayed) Labs Reviewed  ETHANOL - Abnormal; Notable for the following components:      Result Value   Alcohol, Ethyl (B) 71 (*)    All other components within normal limits  CBC - Abnormal; Notable for the following components:   WBC 16.1 (*)    All other components within normal limits  DIFFERENTIAL - Abnormal; Notable for the following components:  Neutro Abs 13.4 (*)    All other components within normal limits  COMPREHENSIVE METABOLIC PANEL - Abnormal; Notable for the following components:   Total Protein 8.2 (*)    All other components within normal limits  URINE DRUG SCREEN, QUALITATIVE ([facility] ONLY) - Abnormal; Notable for the following components:   Cocaine Metabolite,[facility] POSITIVE (*)    All other components within normal limits  CBG MONITORING, ED - Abnormal; Notable for the following components:   Glucose-Capillary 68 (*)    All other components within normal limits  PROTIME-INR  APTT  CBG MONITORING, ED  TROPONIN I (HIGH SENSITIVITY)     EKG My EKG interpretation: Rate of 86, normal sinus rhythm, normal axis, normal intervals.  No acute ST elevations or depressions   RADIOLOGY Independently interpreted CT imaging of head and CTA head and neck with no acute ischemic CVA findings.  Independently  interpreted CT chest and chest x-ray with concern for possible multifocal pneumonia   PROCEDURES:  Critical Care performed: Yes, see critical care procedure note(s)  .Critical Care  Performed by: Janith Lima, MD Authorized by: Janith Lima, MD   Critical care provider statement:    Critical care time (minutes):  30   Critical care was necessary to treat or prevent imminent or life-threatening deterioration of the following conditions: stroke.   Critical care was time spent personally by me on the following activities:  Development of treatment plan with patient or surrogate, discussions with consultants, evaluation of patient's response to treatment, examination of patient, ordering and review of laboratory studies, ordering and review of radiographic studies, ordering and performing treatments and interventions, pulse oximetry, re-evaluation of patient's condition and review of old charts   I assumed direction of critical care for this patient from another provider in my specialty: no     Care discussed with: admitting provider      MEDICATIONS ORDERED IN ED: Medications  cefTRIAXone (ROCEPHIN) 1 g in sodium chloride 0.9 % 100 mL IVPB (1 g Intravenous New Bag/Given 06/25/23 0403)  azithromycin (ZITHROMAX) 500 mg in sodium chloride 0.9 % 250 mL IVPB (has no administration in time range)  iohexol (OMNIPAQUE) 350 MG/ML injection 75 mL (75 mLs Intravenous Contrast Given 06/25/23 0247)     IMPRESSION / MDM / ASSESSMENT AND PLAN / ED COURSE  I reviewed the triage vital signs and the nursing notes.                              Differential diagnosis includes, but is not limited to, CVA, recrudescence of prior stroke, hypertensive bleed, pneumonia, electrolyte abnormality, hypertensive emergency  Patient's presentation is most consistent with acute presentation with potential threat to life or bodily function.  Patient is a 48 year old male with prior history of strokes presenting  today for onset left eye blurry vision as well as left-sided facial numbness.  Onset was 3 and half hours prior to arrival.  NIHSS of of 3.  Stroke alert was called as he was initially within the window.  CT head without contrast with no acute pathology.  Teleneurology evaluated patient.  He is not a TNK candidate given prior history of brain bleeds.  No obvious LVO.  CTA head neck was ordered given prior history of dissection and thrombosis.  This was also negative.  Recommendation for admission for MRI over concerns of possible recrudescence of prior stroke.  Separately, imaging was able to capture signs of  possible multifocal pneumonia.  Patient states he has had cough and shortness of breath recently.  Elevated white blood cell count.  Patient was started on ceftriaxone azithromycin for further care.  Admitted to hospitalist for ongoing pneumonia treatment and stroke workup.  The patient is on the cardiac monitor to evaluate for evidence of arrhythmia and/or significant heart rate changes. Clinical Course as of 06/25/23 0432  Fri Jun 25, 2023  0200 Stroke alert called after bedside evaluation given diminished vision in left eye as well as sensation deficits to left side of the face with significant stroke history and not currently on medication to treat uncontrolled hypertension or stroke prevention [DW]  0241 Tele-Neuro - no TNK with prior SAH. Could be recrudescence. CTA head/neck ordered. If no occlusion or dissection, admit for MRI. [DW]  0247 Glucose-Capillary(!): 68 Given orange juice for oral repletion [DW]    Clinical Course User Index [DW] Janith Lima, MD     FINAL CLINICAL IMPRESSION(S) / ED DIAGNOSES   Final diagnoses:  Numbness and tingling of left side of face  Blurry vision, left eye  Pneumonia of both lungs due to infectious organism, unspecified part of lung     Rx / DC Orders   ED Discharge Orders     None        Note:  This document was prepared using Dragon  voice recognition software and may include unintentional dictation errors.   Janith Lima, MD 06/25/23 [phone]

## 2023-06-25 NOTE — H&P (Signed)
History and Physical    Patient: Robert Jacobson:[id number] DOB: 1975/12/22 DOA: 06/25/2023 DOS: the patient was seen and examined on 06/25/2023 PCP: Pcp, No  Patient coming from: Home  Chief Complaint:  Chief Complaint  Patient presents with   Blurred Vision   Hypertension   HPI: Robert Jacobson is a 48 y.o. male with medical history significant of polysubstance abuse with prior CVA, hyperlipidemia presenting with CVA, polysubstance abuse, hypertension.  Patient reports developing left-sided blurry vision overnight.  Unclear of exact time.  Patient reports cocaine use prior to onset of symptoms.  No slurred speech or confusion.  No focal hemiparesis.  No numbness or tingling.  Noted admission October 2019 for CVA event.  Not on medications currently.  Patient states he does not have insurance.  Still smoking 1 pack/day.  Also 5+ beer drinker and daily.  No reported deficits after CVA.  Not on aspirin or other medical therapy at present.  No nausea.  No fevers or chills.  Cough, shortness of breath and increased work of breathing. Presented to the ER afebrile, blood pressures 130s to 160s over 90s to 110s.  Satting well on room air.  White count 16.1, hemoglobin 15, platelets 311, alcohol level 71, creatinine 0.85.  Cocaine positive UDS.  Troponin within normal limits.  CT head and CTA of the head neck grossly stable.  CT chest with?  Pneumonitis versus multifocal pneumonia. Review of Systems: As mentioned in the history of present illness. All other systems reviewed and are negative. Past Medical History:  Diagnosis Date   Acute ischemic stroke (HCC) 03/18/2018   Hypertension    Stroke (HCC)    Subarachnoid hemorrhage (HCC) 04/01/2018   Noted during 03/28/18   Past Surgical History:  Procedure Laterality Date   IR ANGIO INTRA EXTRACRAN SEL COM CAROTID INNOMINATE BILAT MOD SED  03/18/2018   IR ANGIO VERTEBRAL SEL VERTEBRAL BILAT MOD SED  03/18/2018   RADIOLOGY WITH  ANESTHESIA N/A 03/18/2018   Procedure: IR WITH ANESTHESIA;  Surgeon: Radiologist, Medication, MD;  Location: [facility] OR;  Service: Radiology;  Laterality: N/A;   Social History:  reports that he has been smoking cigarettes. He has a 25 pack-year smoking history. He has never used smokeless tobacco. He reports current alcohol use of about 35.0 standard drinks of alcohol per week. He reports current drug use. Drugs: Cocaine and Marijuana.  No Known Allergies  Family History  Problem Relation Age of Onset   Hypertension Mother    Hypertension Father    Glaucoma Father    Stroke Maternal Grandmother    Cerebral aneurysm Maternal Grandfather     Prior to Admission medications   Medication Sig Start Date End Date Taking? Authorizing Provider  aspirin 325 MG EC tablet Take 1 tablet (325 mg total) by mouth daily. 07/04/21  Yes Enedina Finner, MD  Multiple Vitamin (MULTIVITAMIN WITH MINERALS) TABS tablet Take 1 tablet by mouth daily. 07/05/21  Yes Enedina Finner, MD  naproxen (NAPROSYN) 500 MG tablet Take 1 tablet (500 mg total) by mouth 2 (two) times daily with a meal. 12/22/22  Yes Triplett, Cari B, FNP  atorvastatin (LIPITOR) 40 MG tablet Take 1 tablet (40 mg total) by mouth daily. Patient not taking: Reported on 06/25/2023 07/04/21   Enedina Finner, MD    Physical Exam: Vitals:   06/25/23 0330 06/25/23 0430 06/25/23 0530 06/25/23 0606  BP: (!) 135/98 (!) 152/99 (!) 131/95   Pulse: 85 87 87   Resp:  16 15  Temp:    97.7 F (36.5 C)  TempSrc:    Oral  SpO2:  97% 97%   Weight:      Height:       Physical Exam Constitutional:      Appearance: He is normal weight.  HENT:     Head: Normocephalic and atraumatic.     Nose: Nose normal.     Mouth/Throat:     Mouth: Mucous membranes are moist.     Comments: Poor dentition   Eyes:     Pupils: Pupils are equal, round, and reactive to light.  Cardiovascular:     Rate and Rhythm: Normal rate and regular rhythm.  Pulmonary:     Effort: Pulmonary  effort is normal.  Abdominal:     General: Bowel sounds are normal.  Musculoskeletal:        General: Normal range of motion.  Skin:    General: Skin is warm.  Neurological:     General: No focal deficit present.  Psychiatric:        Mood and Affect: Mood normal.     Data Reviewed:  There are no new results to review at this time.  CT Chest Wo Contrast CLINICAL DATA:  Abnormal CTA  EXAM: CT CHEST WITHOUT CONTRAST  TECHNIQUE: Multidetector CT imaging of the chest was performed following the standard protocol without IV contrast.  RADIATION DOSE REDUCTION: This exam was performed according to the departmental dose-optimization program which includes automated exposure control, adjustment of the mA and/or kV according to patient size and/or use of iterative reconstruction technique.  COMPARISON:  None Available.  FINDINGS: Cardiovascular: No significant vascular findings. Normal heart size. No pericardial effusion. Atheromatous calcifications seen at the LAD.  Mediastinum/Nodes: No mass or adenopathy.  Lungs/Pleura: Ground-glass opacity in the bilateral lungs mainly in the mid chest. Mild airway thickening at the lung bases. No consolidation, septal thickening, effusion, or pneumothorax.  Upper Abdomen: Negative  Musculoskeletal: Negative  IMPRESSION: Ground-glass opacity in the bilateral lungs suggesting pneumonitis, correlate for atypical/viral infection, history of inhalational irritant, or autoimmune disease.  Coronary calcification.  Electronically Signed   By: Tiburcio Pea M.D.   On: 06/25/2023 04:21 DG Chest 1 View CLINICAL DATA:  Shortness of breath. Productive cough. Leukocytosis.  EXAM: CHEST  1 VIEW  COMPARISON:  Chest x-ray 07/05/2013.  FINDINGS: The heart size and mediastinal contours are within normal limits. Both lungs are clear. No visible pleural effusions or pneumothorax. No acute osseous abnormality.  IMPRESSION: No active  disease.  Electronically Signed   By: Feliberto Harts M.D.   On: 06/25/2023 03:39 CT ANGIO HEAD NECK W WO CM (CODE STROKE) CLINICAL DATA:  Neuro deficit, acute, stroke suspected  EXAM: CT ANGIOGRAPHY HEAD AND NECK WITH AND WITHOUT CONTRAST  TECHNIQUE: Multidetector CT imaging of the head and neck was performed using the standard protocol during bolus administration of intravenous contrast. Multiplanar CT image reconstructions and MIPs were obtained to evaluate the vascular anatomy. Carotid stenosis measurements (when applicable) are obtained utilizing NASCET criteria, using the distal internal carotid diameter as the denominator.  RADIATION DOSE REDUCTION: This exam was performed according to the departmental dose-optimization program which includes automated exposure control, adjustment of the mA and/or kV according to patient size and/or use of iterative reconstruction technique.  CONTRAST:  75mL OMNIPAQUE IOHEXOL 350 MG/ML SOLN  COMPARISON:  Same day CT head.  CTA head/neck March 30, 2019.  FINDINGS: CTA NECK FINDINGS  Aortic arch: Great vessel origins are patent without significant  stenosis.  Right carotid system: No evidence of dissection, stenosis (50% or greater), or occlusion.  Left carotid system: No evidence of dissection, stenosis (50% or greater), or occlusion.  Vertebral arteries: Codominant. No evidence of dissection, stenosis (50% or greater), or occlusion.  Skeleton: No acute abnormality on limited assessment.  Other neck: No acute abnormality on limited assessment.  Upper chest: Patchy ground-glass and consolidative opacities in the visualized lung apices.  Review of the MIP images confirms the above findings  CTA HEAD FINDINGS  Anterior circulation: Bilateral intracranial ICAs, MCAs, and ACAs are patent without proximal hemodynamically significant stenosis. No aneurysm identified.  Posterior circulation: Bilateral intradural vertebral  arteries, basilar artery and bilateral posterior rods are patent without proximal hemodynamically significant stenosis.  Venous sinuses: As permitted by contrast timing, patent.  Review of the MIP images confirms the above findings  IMPRESSION: 1. No large vessel occlusion or proximal hemodynamically significant stenosis. 2. Patchy ground-glass and consolidative opacities in the visualized lung apices, new since 2020 and concerning for multifocal pneumonia. 3.  Emphysema (ICD10-J43.9).  Electronically Signed   By: Feliberto Harts M.D.   On: 06/25/2023 03:05 CT HEAD CODE STROKE WO CONTRAST CLINICAL DATA:  Code stroke.  Neuro deficit, acute, stroke suspected  EXAM: CT HEAD WITHOUT CONTRAST  TECHNIQUE: Contiguous axial images were obtained from the base of the skull through the vertex without intravenous contrast.  RADIATION DOSE REDUCTION: This exam was performed according to the departmental dose-optimization program which includes automated exposure control, adjustment of the mA and/or kV according to patient size and/or use of iterative reconstruction technique.  COMPARISON:  None Available.  FINDINGS: Brain: No evidence of acute large vascular territory infarct, acute hemorrhage, mass lesion, midline shift or hydrocephalus.  Vascular: No hyperdense vessel identified.  Skull: No acute fracture.  Sinuses/Orbits: Clear sinuses.  No acute orbital findings.  Other: No mastoid effusions.  ASPECTS [facility] Stroke Program Early CT Score) total score (0-10 with 10 being normal): 10.  IMPRESSION: No evidence of acute intracranial abnormality.   ASPECTS is 10.  Code stroke imaging results were communicated on 06/25/2023 at 2:13 am to provider Dr. Anner Crete Via telephone, who verbally acknowledged these results.  Electronically Signed   By: Feliberto Harts M.D.   On: 06/25/2023 02:14  Lab Results  Component Value Date   WBC 16.1 (H) 06/25/2023   HGB 15.1  06/25/2023   HCT 44.4 06/25/2023   MCV 94.9 06/25/2023   PLT 311 06/25/2023   Last metabolic panel Lab Results  Component Value Date   GLUCOSE 82 06/25/2023   NA 137 06/25/2023   K 4.1 06/25/2023   CL 100 06/25/2023   CO2 22 06/25/2023   BUN 13 06/25/2023   CREATININE 0.85 06/25/2023   GFRNONAA >60 06/25/2023   CALCIUM 9.1 06/25/2023   PROT 8.2 (H) 06/25/2023   ALBUMIN 4.4 06/25/2023   BILITOT 0.5 06/25/2023   ALKPHOS 56 06/25/2023   AST 35 06/25/2023   ALT 21 06/25/2023   ANIONGAP 15 06/25/2023    Assessment and Plan: * Acute CVA (cerebrovascular accident) (HCC) Acute L sided blurry vision- improving in setting of prior CVA, medical noncompliance, HTN and polysubstance abuse  UDS + cocaine  + ETOH use  Not on regular aspirin therapy  CT head and CTA head and neck grossly stable  Will plan for formal CVA evaluation including MRI of the brain, 2D ECHO, risk stratification labs  Restart ASA and statin  Discussed substance abuse cessation at length  Neuro consultation  as clinically appropriate     PNA (pneumonia) + SOB, increased WOB w/ noted ? Multifocal PNA vs. Pneumonitis on imaging today  IV rocephin and azithromycin for infectious coverage  Blood and resp cultures  Add on prednisone in setting of tobacco abuse and mild wheezing  Supplemental O2 prn  Follow    Nicotine dependence, cigarettes, uncomplicated 1 PPD smoker  Discussed cessation  Nicotine patch    Cocaine abuse (HCC) + regular cocaine abuse  Discussed cessation  Monitor   Alcohol dependence (HCC) 5+ beer daily drinker  Start CIWA protocol and vitamin supplementation    Hyperlipidemia Lipid panel pending  Resume statin    Essential (primary) hypertension Allow for permissive HTN in setting of CVA evaluation  Prn IV labetalol for SBP>220 or DBP>110  Titrate BP regimen        Advance Care Planning:   Code Status: Full Code   Consults: None   Family Communication: No family  present   Severity of Illness: The appropriate patient status for this patient is INPATIENT. Inpatient status is judged to be reasonable and necessary in order to provide the required intensity of service to ensure the patient's safety. The patient's presenting symptoms, physical exam findings, and initial radiographic and laboratory data in the context of their chronic comorbidities is felt to place them at high risk for further clinical deterioration. Furthermore, it is not anticipated that the patient will be medically stable for discharge from the hospital within 2 midnights of admission.   * I certify that at the point of admission it is my clinical judgment that the patient will require inpatient hospital care spanning beyond 2 midnights from the point of admission due to high intensity of service, high risk for further deterioration and high frequency of surveillance required.*  Author: Floydene Flock, MD 06/25/2023 8:03 AM  For on call review [email or web address].

## 2023-06-25 NOTE — TOC CM/SW Note (Signed)
TOC consult received for [facility] resources. Resources added to AVS.  Alfonso Ramus, LCSW Transitions of Care Department [phone]

## 2023-06-25 NOTE — Assessment & Plan Note (Signed)
1 PPD smoker  Discussed cessation  Nicotine patch   

## 2023-06-25 NOTE — Assessment & Plan Note (Signed)
+   SOB, increased WOB w/ noted ? Multifocal PNA vs. Pneumonitis on imaging today  IV rocephin and azithromycin for infectious coverage  Blood and resp cultures  Add on prednisone in setting of tobacco abuse and mild wheezing  Supplemental O2 prn  Follow

## 2023-06-25 NOTE — Assessment & Plan Note (Signed)
5+ beer daily drinker  Start CIWA protocol and vitamin supplementation

## 2023-06-25 NOTE — Assessment & Plan Note (Signed)
Allow for permissive HTN in setting of CVA evaluation  Prn IV labetalol for SBP>220 or DBP>110  Titrate BP regimen

## 2023-06-25 NOTE — Assessment & Plan Note (Signed)
+   regular cocaine abuse  Discussed cessation  Monitor

## 2023-06-25 NOTE — Assessment & Plan Note (Signed)
Acute L sided blurry vision- improving in setting of prior CVA, medical noncompliance, HTN and polysubstance abuse  UDS + cocaine  + ETOH use  Not on regular aspirin therapy  CT head and CTA head and neck grossly stable  Will plan for formal CVA evaluation including MRI of the brain, 2D ECHO, risk stratification labs  Restart ASA and statin  Discussed substance abuse cessation at length  Neuro consultation as clinically appropriate

## 2023-06-26 ENCOUNTER — Inpatient Hospital Stay
Admit: 2023-06-26 | Discharge: 2023-06-26 | Disposition: A | Discharge status: Home / Self Care | Payer: Self-pay | Attending: Family Medicine | Admitting: Family Medicine

## 2023-06-26 DIAGNOSIS — H538 Other visual disturbances: Secondary | ICD-10-CM

## 2023-06-26 LAB — RESPIRATORY PANEL BY PCR

## 2023-06-26 LAB — CBC WITH DIFFERENTIAL/PLATELET
Abs Immature Granulocytes: 0.07 10*3/uL (ref 0.00–0.07)
Basophils Absolute: 0 10*3/uL (ref 0.0–0.1)
Basophils Relative: 0 %
Eosinophils Absolute: 0.1 10*3/uL (ref 0.0–0.5)
Eosinophils Relative: 1 %
HCT: 35.5 % — ABNORMAL LOW (ref 39.0–52.0)
Hemoglobin: 12.4 g/dL — ABNORMAL LOW (ref 13.0–17.0)
Immature Granulocytes: 1 %
Lymphocytes Relative: 13 %
Lymphs Abs: 1.5 10*3/uL (ref 0.7–4.0)
MCH: 31.6 pg (ref 26.0–34.0)
MCHC: 34.9 g/dL (ref 30.0–36.0)
MCV: 90.6 fL (ref 80.0–100.0)
Monocytes Absolute: 0.6 10*3/uL (ref 0.1–1.0)
Monocytes Relative: 5 %
Neutro Abs: 9.3 10*3/uL — ABNORMAL HIGH (ref 1.7–7.7)
Neutrophils Relative %: 80 %
Platelets: 292 10*3/uL (ref 150–400)
RBC: 3.92 MIL/uL — ABNORMAL LOW (ref 4.22–5.81)
RDW: 13.7 % (ref 11.5–15.5)
WBC: 11.6 10*3/uL — ABNORMAL HIGH (ref 4.0–10.5)
nRBC: 0 % (ref 0.0–0.2)

## 2023-06-26 LAB — COMPREHENSIVE METABOLIC PANEL
ALT: 14 U/L (ref 0–44)
AST: 13 U/L — ABNORMAL LOW (ref 15–41)
Albumin: 3.4 g/dL — ABNORMAL LOW (ref 3.5–5.0)
Alkaline Phosphatase: 39 U/L (ref 38–126)
Anion gap: 9 (ref 5–15)
BUN: 16 mg/dL (ref 6–20)
CO2: 24 mmol/L (ref 22–32)
Calcium: 8.7 mg/dL — ABNORMAL LOW (ref 8.9–10.3)
Chloride: 104 mmol/L (ref 98–111)
Creatinine, Ser: 0.79 mg/dL (ref 0.61–1.24)
GFR, Estimated: >60 mL/min (ref >60)
Glucose, Bld: 138 mg/dL — ABNORMAL HIGH (ref 70–99)
Potassium: 4 mmol/L (ref 3.5–5.1)
Sodium: 137 mmol/L (ref 135–145)
Total Bilirubin: 0.5 mg/dL (ref 0.0–1.2)
Total Protein: 6.3 g/dL — ABNORMAL LOW (ref 6.5–8.1)

## 2023-06-26 LAB — LIPID PANEL
Cholesterol: 171 mg/dL (ref 0–200)
HDL: 66 mg/dL (ref >40)
LDL Cholesterol: 95 mg/dL (ref 0–99)
Total CHOL/HDL Ratio: 2.6 {ratio}
Triglycerides: 50 mg/dL (ref <150)
VLDL: 10 mg/dL (ref 0–40)

## 2023-06-26 LAB — STREP PNEUMONIAE URINARY ANTIGEN: Strep Pneumo Urinary Antigen: NEGATIVE

## 2023-06-26 LAB — HIV ANTIBODY (ROUTINE TESTING W REFLEX): HIV Screen 4th Generation wRfx: NONREACTIVE

## 2023-06-26 LAB — SARS CORONAVIRUS 2 BY RT PCR: SARS Coronavirus 2 by RT PCR: NEGATIVE

## 2023-06-26 MED ORDER — ASPIRIN 81 MG PO TBEC: 81.0000 mg | DELAYED_RELEASE_TABLET | Freq: Every day | ORAL | 2 refills | Status: AC

## 2023-06-26 MED ORDER — ATORVASTATIN CALCIUM 40 MG PO TABS: 40.0000 mg | ORAL_TABLET | Freq: Every day | ORAL | 2 refills | Status: AC

## 2023-06-26 MED ORDER — HYDROXYZINE HCL 25 MG PO TABS: 25.0000 mg | ORAL_TABLET | Freq: Every evening | ORAL | 1 refills | Status: AC | PRN

## 2023-06-26 MED ORDER — AMLODIPINE BESYLATE 10 MG PO TABS: 10.0000 mg | ORAL_TABLET | Freq: Every day | ORAL | 1 refills | Status: AC

## 2023-06-26 NOTE — Progress Notes (Signed)
  Echocardiogram 2D Echocardiogram has NOT been performed. Upon entering the room, patient stated he is being discharged will get echo as an outpatient.  Lenor Coffin 06/26/2023, 2:31 PM

## 2023-06-26 NOTE — Discharge Summary (Signed)
Robert Jacobson WGN:[id number] DOB: 07-Jun-1976 DOA: 06/25/2023  PCP: Pcp, No  Admit date: 06/25/2023 Discharge date: 06/26/2023  Time spent: 35 minutes  Recommendations for Outpatient Follow-up:  Establish with pcp     Discharge Diagnoses:  Principal Problem:   Blurry vision, left eye Active Problems:   Acute CVA (cerebrovascular accident) (HCC)   PNA (pneumonia)   Essential (primary) hypertension   Hyperlipidemia   Alcohol dependence (HCC)   Cocaine abuse (HCC)   Nicotine dependence, cigarettes, uncomplicated   Discharge Condition: stable  Diet recommendation: heart healthy  Filed Weights   06/25/23 0130  Weight: 68 kg    History of present illness:  From admission h and p Robert Jacobson is a 48 y.o. male with medical history significant of polysubstance abuse with prior CVA, hyperlipidemia presenting with CVA, polysubstance abuse, hypertension.  Patient reports developing left-sided blurry vision overnight.  Unclear of exact time.  Patient reports cocaine use prior to onset of symptoms.  No slurred speech or confusion.  No focal hemiparesis.  No numbness or tingling.  Noted admission October 2019 for CVA event.  Not on medications currently.  Patient states he does not have insurance.  Still smoking 1 pack/day.  Also 5+ beer drinker and daily.  No reported deficits after CVA.  Not on aspirin or other medical therapy at present.  No nausea.  No fevers or chills.  Cough, shortness of breath and increased work of breathing.   Hospital Course:  Patient presents with transient episode of left eye blurry vision. No loss of vision or other neurologic symptoms. Non-focal neurologic exam. Admitted with concern for TIA. MRI showed no signs of acute stroke or other acute process. Reviewed case with Dr. Selina Cooley of neurology - blurry vision is not suggestive of TIA/CVA, and neuroimaging is reassuring. Patient is not on secondary prevention or BP control meds, so will rx these at  discharge. Provided patient with information on local primary care providers including those who accept uninsured patients. Cocaine positive - cessation counseling provided. CT chest showing signs possible pneumonia but patient is asymptomatic and did recently smoke cocaine, suspect these finds to be cocaine lung injury. Covid test and respiratory viral panel were not obtained on admission, these were obtained day of discharge and we will f/u results. I will also attempt to add on hcv testing to his existing labs.   Procedures: none   Consultations: none  Discharge Exam: Vitals:   06/26/23 0828 06/26/23 1228  BP: (!) 127/94 (!) 153/98  Pulse: 72 85  Resp: 18 19  Temp: 97.9 F (36.6 C) 98.8 F (37.1 C)  SpO2: 100% 100%    General: NAD Cardiovascular: rrr Respiratory: ctab Neuro: cn 2-12 grossly intact, 5/5 upper/lower strength, normal gait  Discharge Instructions   Discharge Instructions     Diet - low sodium heart healthy   Complete by: As directed    Increase activity slowly   Complete by: As directed       Allergies as of 06/26/2023   No Known Allergies      Medication List     STOP taking these medications    multivitamin with minerals Tabs tablet   naproxen 500 MG tablet Commonly known as: Naprosyn       TAKE these medications    amLODipine 10 MG tablet Commonly known as: NORVASC Take 1 tablet (10 mg total) by mouth daily.   aspirin EC 81 MG tablet Take 1 tablet (81 mg total) by mouth daily. Take  after 12 weeks for prevention of preeclampsia later in pregnancy What changed:  medication strength how much to take additional instructions   atorvastatin 40 MG tablet Commonly known as: LIPITOR Take 1 tablet (40 mg total) by mouth daily.   hydrOXYzine 25 MG tablet Commonly known as: ATARAX Take 1 tablet (25 mg total) by mouth at bedtime as needed (sleep).       No Known Allergies  Follow-up Information     [facility] Follow up.   Specialty: General Practice Contact information: [address] [place] [address] [phone]         Center, [facility] Follow up.   Contact information: [address] [place] [address] [phone]                  The results of significant diagnostics from this hospitalization (including imaging, microbiology, ancillary and laboratory) are listed below for reference.    Significant Diagnostic Studies: MR BRAIN WO CONTRAST Addendum Date: 06/25/2023 ADDENDUM REPORT: 06/25/2023 12:22 ADDENDUM: 1.7 cm T2 hyperintense left occipital scalp nodule, increased in size from 2023 but without worrisome features and favored to reflect a benign etiology such as a trichilemmal cyst. Electronically Signed   By: Sebastian Ache M.D.   On: 06/25/2023 12:22   Result Date: 06/25/2023 CLINICAL DATA:  Transient ischemic attack (TIA). Blurry vision. History of stroke and hypertension. EXAM: MRI HEAD WITHOUT CONTRAST TECHNIQUE: Multiplanar, multiecho pulse sequences of the brain and surrounding structures were obtained without intravenous contrast. COMPARISON:  Head CT and CTA 06/25/2023.  Head MRI 07/03/2021. FINDINGS: Brain: There is no evidence of an acute infarct, intracranial hemorrhage, mass, midline shift, or extra-axial fluid collection. A punctate focus of T2 hyperintensity in the right frontal white matter at the level of the centrum semiovale is unchanged from the prior MRI and corresponds to the site of an acute infarct in 2019. A 5 mm focus of T2 hyperintensity in the left periatrial white matter is new from the 2023 MRI and compatible with an interval, chronic small-vessel insult. The ventricles are normal in size. A small focus of cortical encephalomalacia anteriorly in the right frontal lobe is unchanged and may reflect the sequelae of remote trauma. Vascular: Major intracranial vascular flow voids are preserved. Skull and upper  cervical spine: Unremarkable bone marrow signal. Sinuses/Orbits: Unremarkable orbits. Small left maxillary sinus with mild mucosal thickening. Clear mastoid air cells. Other: None. IMPRESSION: 1. No acute intracranial abnormality. 2. 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 Electronically Signed: By: Sebastian Ache M.D. On: 06/25/2023 09:03   CT Chest Wo Contrast Result Date: 06/25/2023 CLINICAL DATA:  Abnormal CTA EXAM: CT CHEST WITHOUT CONTRAST TECHNIQUE: Multidetector CT imaging of the chest was performed following the standard protocol without IV contrast. RADIATION DOSE REDUCTION: This exam was performed according to the departmental dose-optimization program which includes automated exposure control, adjustment of the mA and/or kV according to patient size and/or use of iterative reconstruction technique. COMPARISON:  None Available. FINDINGS: Cardiovascular: No significant vascular findings. Normal heart size. No pericardial effusion. Atheromatous calcifications seen at the LAD. Mediastinum/Nodes: No mass or adenopathy. Lungs/Pleura: Ground-glass opacity in the bilateral lungs mainly in the mid chest. Mild airway thickening at the lung bases. No consolidation, septal thickening, effusion, or pneumothorax. Upper Abdomen: Negative Musculoskeletal: Negative IMPRESSION: Ground-glass opacity in the bilateral lungs suggesting pneumonitis, correlate for atypical/viral infection, history of  inhalational irritant, or autoimmune disease. Coronary calcification. Electronically Signed   By: Tiburcio Pea M.D.   On: 06/25/2023 04:21   DG Chest 1 View Result Date: 06/25/2023 CLINICAL DATA:  Shortness of breath. Productive cough. Leukocytosis. EXAM: CHEST  1 VIEW COMPARISON:  Chest x-ray 07/05/2013. FINDINGS: The heart size and mediastinal contours are within normal limits. Both lungs are clear. No visible pleural  effusions or pneumothorax. No acute osseous abnormality. IMPRESSION: No active disease. Electronically Signed   By: Feliberto Harts M.D.   On: 06/25/2023 03:39   CT ANGIO HEAD NECK W WO CM (CODE STROKE) Result Date: 06/25/2023 CLINICAL DATA:  Neuro deficit, acute, stroke suspected EXAM: CT ANGIOGRAPHY HEAD AND NECK WITH AND WITHOUT CONTRAST TECHNIQUE: Multidetector CT imaging of the head and neck was performed using the standard protocol during bolus administration of intravenous contrast. Multiplanar CT image reconstructions and MIPs were obtained to evaluate the vascular anatomy. Carotid stenosis measurements (when applicable) are obtained utilizing NASCET criteria, using the distal internal carotid diameter as the denominator. RADIATION DOSE REDUCTION: This exam was performed according to the departmental dose-optimization program which includes automated exposure control, adjustment of the mA and/or kV according to patient size and/or use of iterative reconstruction technique. CONTRAST:  75mL OMNIPAQUE IOHEXOL 350 MG/ML SOLN COMPARISON:  Same day CT head.  CTA head/neck March 30, 2019. FINDINGS: CTA NECK FINDINGS Aortic arch: Great vessel origins are patent without significant stenosis. Right carotid system: No evidence of dissection, stenosis (50% or greater), or occlusion. Left carotid system: No evidence of dissection, stenosis (50% or greater), or occlusion. Vertebral arteries: Codominant. No evidence of dissection, stenosis (50% or greater), or occlusion. Skeleton: No acute abnormality on limited assessment. Other neck: No acute abnormality on limited assessment. Upper chest: Patchy ground-glass and consolidative opacities in the visualized lung apices. Review of the MIP images confirms the above findings CTA HEAD FINDINGS Anterior circulation: Bilateral intracranial ICAs, MCAs, and ACAs are patent without proximal hemodynamically significant stenosis. No aneurysm identified. Posterior circulation:  Bilateral intradural vertebral arteries, basilar artery and bilateral posterior rods are patent without proximal hemodynamically significant stenosis. Venous sinuses: As permitted by contrast timing, patent. Review of the MIP images confirms the above findings IMPRESSION: 1. No large vessel occlusion or proximal hemodynamically significant stenosis. 2. Patchy ground-glass and consolidative opacities in the visualized lung apices, new since 2020 and concerning for multifocal pneumonia. 3.  Emphysema (ICD10-J43.9). Electronically Signed   By: Feliberto Harts M.D.   On: 06/25/2023 03:05   CT HEAD CODE STROKE WO CONTRAST Result Date: 06/25/2023 CLINICAL DATA:  Code stroke.  Neuro deficit, acute, stroke suspected EXAM: CT HEAD WITHOUT CONTRAST TECHNIQUE: Contiguous axial images were obtained from the base of the skull through the vertex without intravenous contrast. RADIATION DOSE REDUCTION: This exam was performed according to the departmental dose-optimization program which includes automated exposure control, adjustment of the mA and/or kV according to patient size and/or use of iterative reconstruction technique. COMPARISON:  None Available. FINDINGS: Brain: No evidence of acute large vascular territory infarct, acute hemorrhage, mass lesion, midline shift or hydrocephalus. Vascular: No hyperdense vessel identified. Skull: No acute fracture. Sinuses/Orbits: Clear sinuses.  No acute orbital findings. Other: No mastoid effusions. ASPECTS [facility] Stroke Program Early CT Score) total score (0-10 with 10 being normal): 10. IMPRESSION: No evidence of acute intracranial abnormality.   ASPECTS is 10. Code stroke imaging results were communicated on 06/25/2023 at 2:13 am to provider Dr. Anner Crete Via telephone, who verbally acknowledged these results. Electronically Signed  By: Feliberto Harts M.D.   On: 06/25/2023 02:14    Microbiology: Recent Results (from the past 240 hours)  Culture, blood (routine x 2) Call MD  if unable to obtain prior to antibiotics being given     Status: None (Preliminary result)   Collection Time: 06/25/23  6:14 PM   Specimen: BLOOD  Result Value Ref Range Status   Specimen Description BLOOD LEFT ANTECUBITAL  Final   Special Requests   Final    BOTTLES DRAWN AEROBIC AND ANAEROBIC Blood Culture results may not be optimal due to an inadequate volume of blood received in culture bottles   Culture   Final    NO GROWTH < 12 HOURS Performed at [facility], [address], [place], [place] [address]    Report Status PENDING  Incomplete  Culture, blood (routine x 2) Call MD if unable to obtain prior to antibiotics being given     Status: None (Preliminary result)   Collection Time: 06/25/23  6:18 PM   Specimen: BLOOD  Result Value Ref Range Status   Specimen Description BLOOD BLOOD RIGHT HAND  Final   Special Requests   Final    BOTTLES DRAWN AEROBIC AND ANAEROBIC Blood Culture results may not be optimal due to an inadequate volume of blood received in culture bottles   Culture   Final    NO GROWTH < 12 HOURS Performed at [facility], [address] [place], [place] [address]    Report Status PENDING  Incomplete     Labs: Basic Metabolic Panel: Recent Labs  Lab 06/25/23 0158 06/26/23 0316  NA 137 137  K 4.1 4.0  CL 100 104  CO2 22 24  GLUCOSE 82 138*  BUN 13 16  CREATININE 0.85 0.79  CALCIUM 9.1 8.7*   Liver Function Tests: Recent Labs  Lab 06/25/23 0158 06/26/23 0316  AST 35 13*  ALT 21 14  ALKPHOS 56 39  BILITOT 0.5 0.5  PROT 8.2* 6.3*  ALBUMIN 4.4 3.4*   No results for input(s): "LIPASE", "AMYLASE" in the last 168 hours. No results for input(s): "AMMONIA" in the last 168 hours. CBC: Recent Labs  Lab 06/25/23 0158 06/26/23 0316  WBC 16.1* 11.6*  NEUTROABS 13.4* 9.3*  HGB 15.1 12.4*  HCT 44.4 35.5*  MCV 94.9 90.6  PLT 311 292   Cardiac Enzymes: No results for input(s): "CKTOTAL", "CKMB", "CKMBINDEX", "TROPONINI" in the  last 168 hours. BNP: BNP (last 3 results) No results for input(s): "BNP" in the last 8760 hours.  ProBNP (last 3 results) No results for input(s): "PROBNP" in the last 8760 hours.  CBG: Recent Labs  Lab 06/25/23 0219 06/25/23 0303  GLUCAP 68* 80       Signed:  Silvano Bilis MD.  [facility] 06/26/2023, 1:57 PM

## 2023-06-28 LAB — LEGIONELLA PNEUMOPHILA SEROGP 1 UR AG: L. pneumophila Serogp 1 Ur Ag: NEGATIVE

## 2023-06-30 LAB — CULTURE, BLOOD (ROUTINE X 2)
Culture: NO GROWTH
Culture: NO GROWTH

## 2023-10-11 IMAGING — CT CT HEAD W/O CM
4 series · 17 of 47 positions shown, 19 images · non-contrast
Comparison: 03/30/2019

CLINICAL DATA: Right arm weakness and blurred vision since 7 a.m.
today.



[Series 2: head bone · axial · 0.42mm/px · z∈[+258,+310]mm · 4 of 75 slices shown]
[im 8/75  bone]
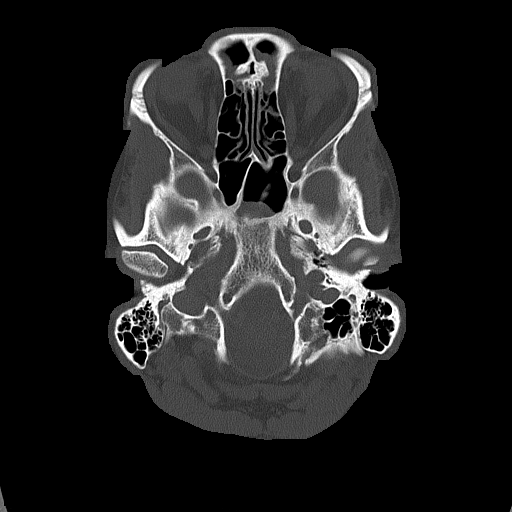
[im 15/75  bone]
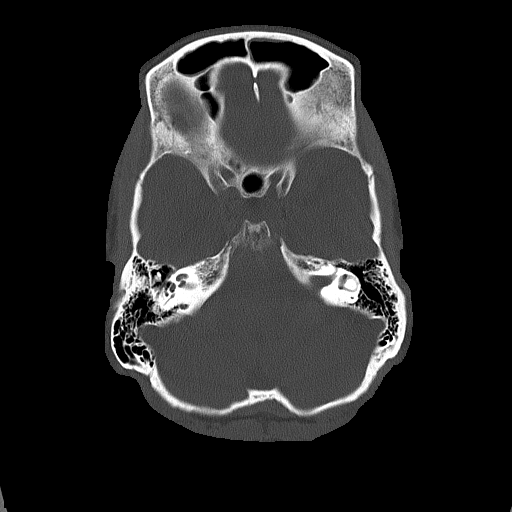
[im 23/75  bone]
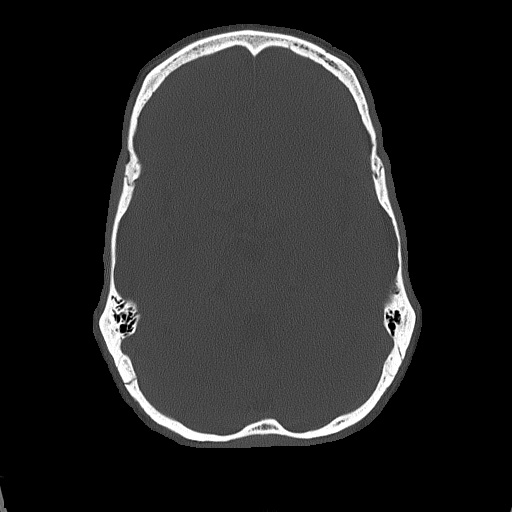
[im 34/75  bone]
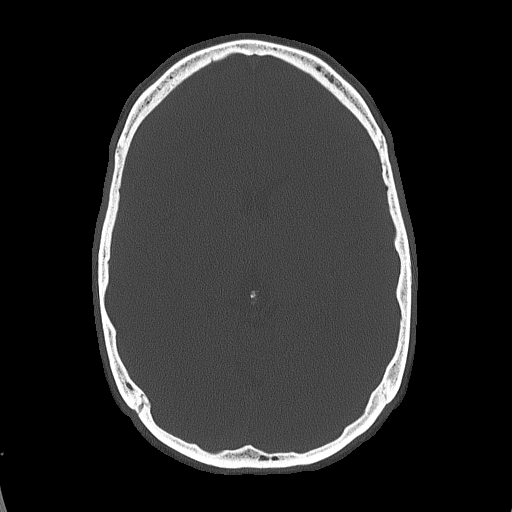

[Series 3: head wo · axial · 0.42mm/px · z∈[+259,+369]mm · 7 of 30 slices shown, 9 images]
[im 4/30  brain]
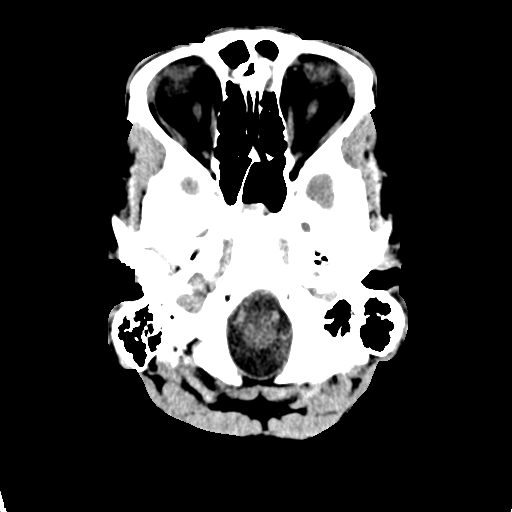
[im 4/30  bone]
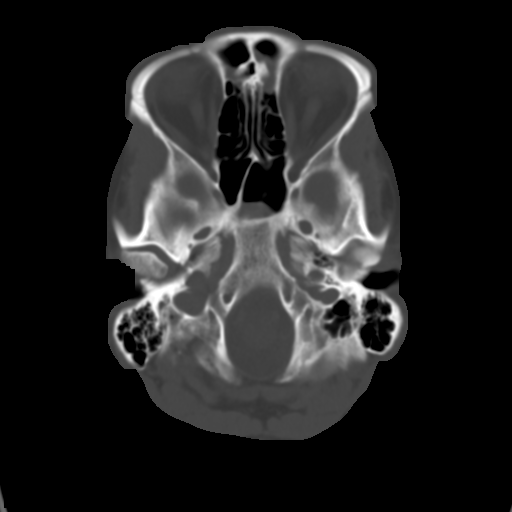
[im 8/30  brain]
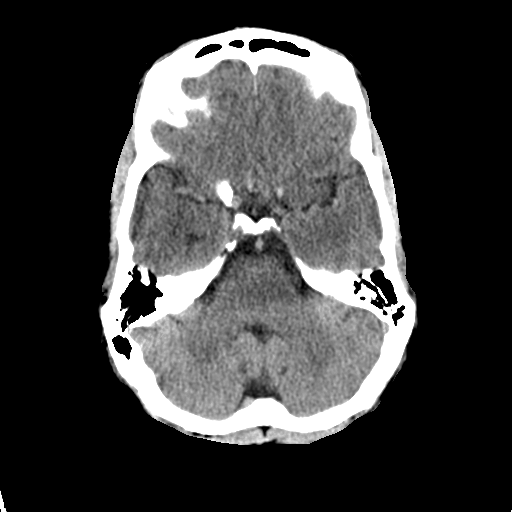
[im 11/30  brain]
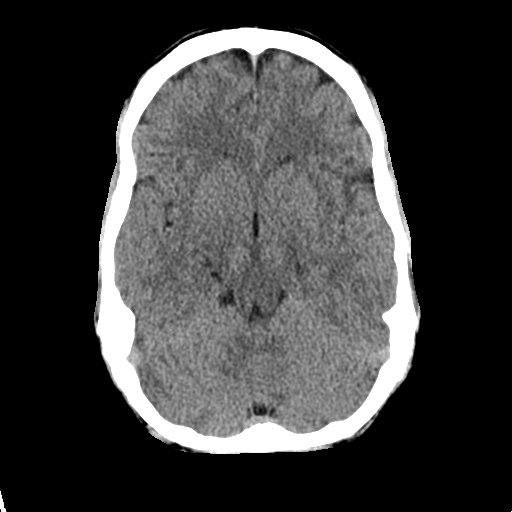
[im 15/30  brain]
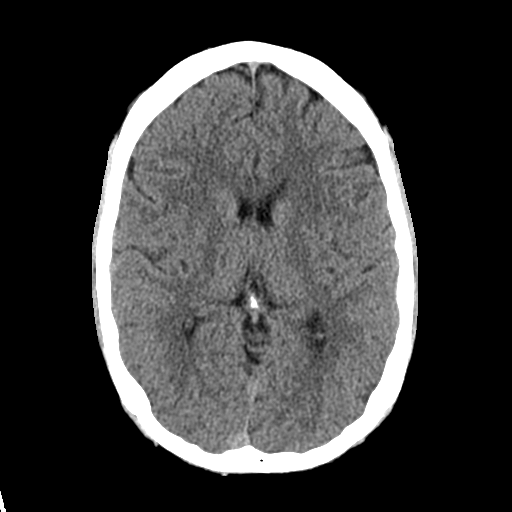
[im 19/30  brain]
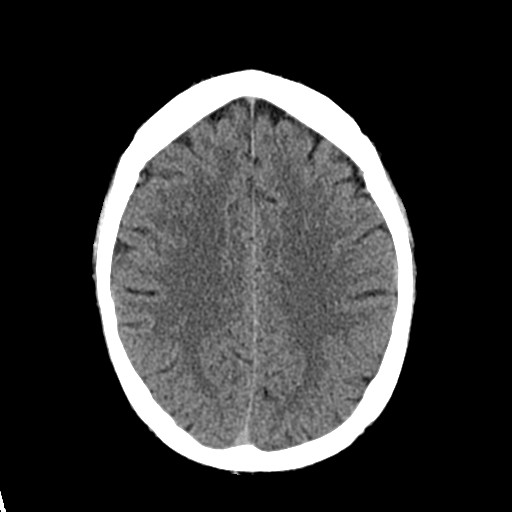
[im 19/30  bone]
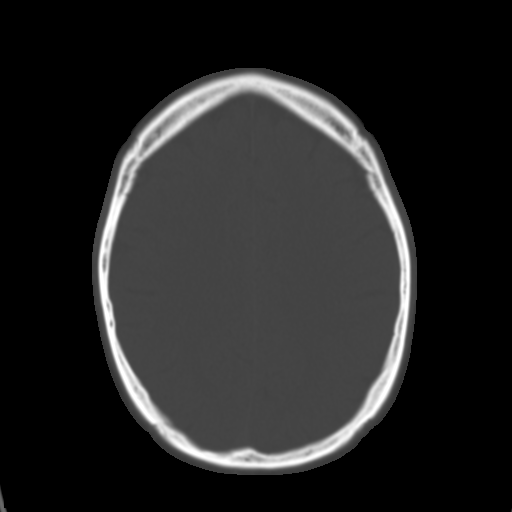
[im 22/30  brain]
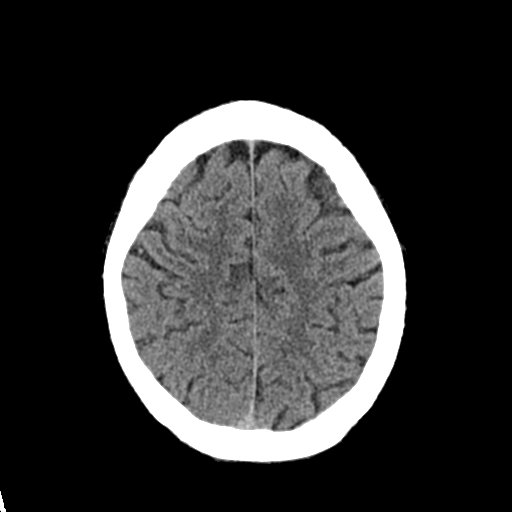
[im 26/30  brain]
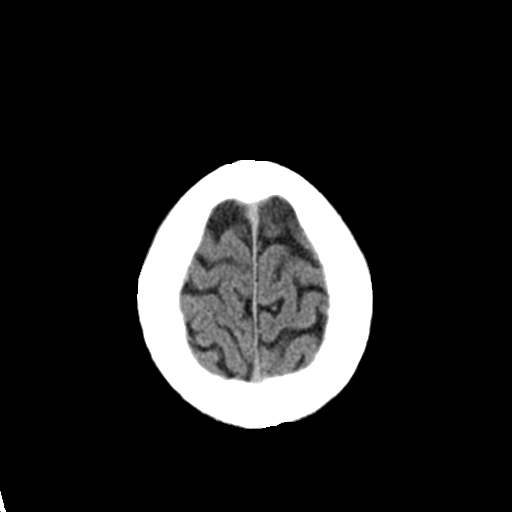

[Series 4: coronal soft tissue · coronal · 0.31mm/px · 3 of 66 slices shown]
[im 22/66  brain]
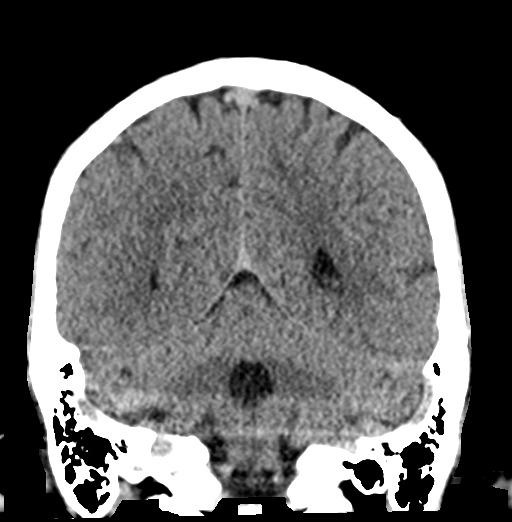
[im 29/66  brain]
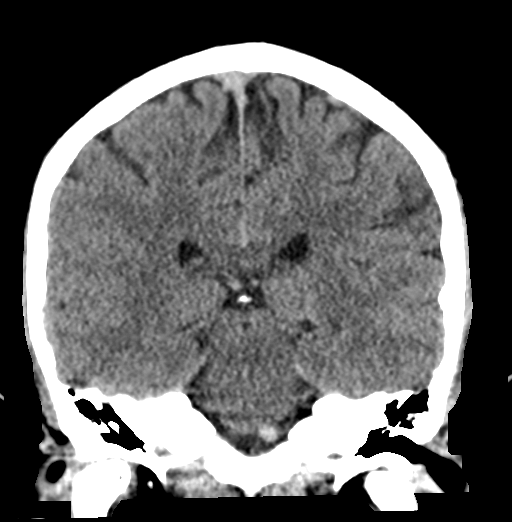
[im 37/66  brain]
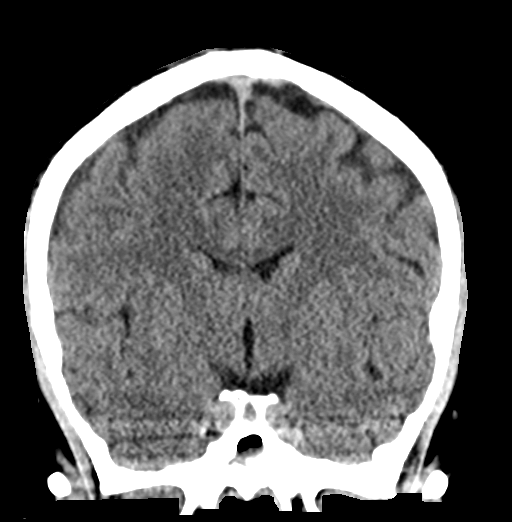

[Series 5: sagittal soft tissue · sagittal · 0.31mm/px · 3 of 53 slices shown]
[im 18/53  brain]
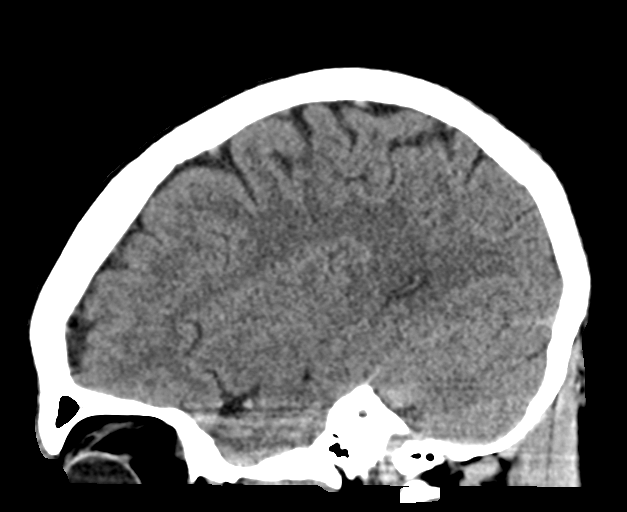
[im 27/53  brain]
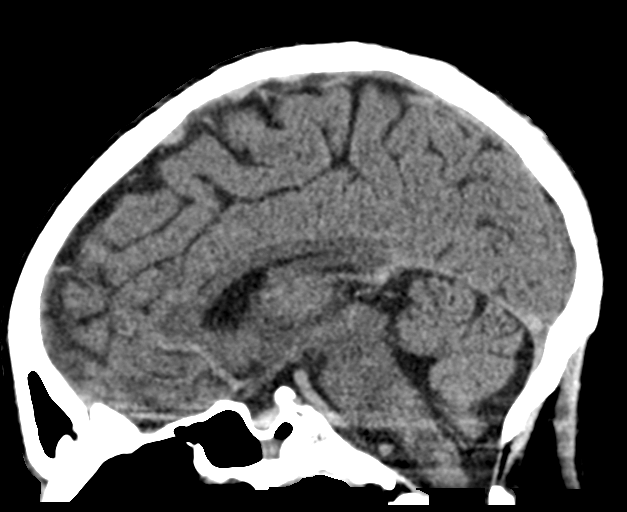
[im 35/53  brain]
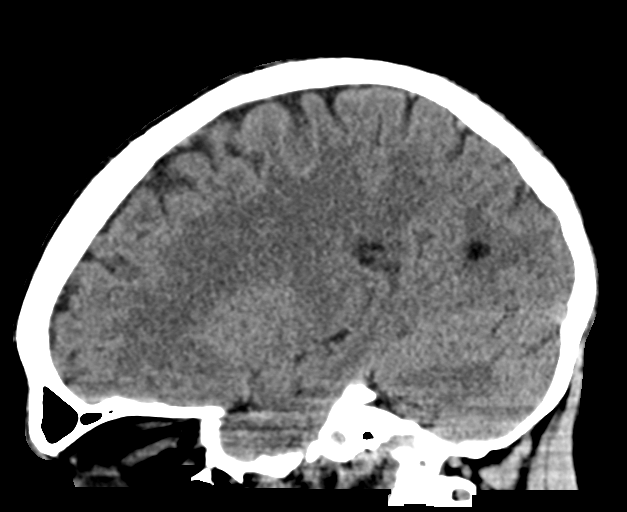

[17 of 47 positions shown; findings below may reference images not displayed]

FINDINGS: Brain: Normal appearing cerebral hemispheres and posterior fossa
structures. Normal size and position of the ventricles. No
intracranial hemorrhage, mass lesion or CT evidence of acute
infarction.

Vascular: No hyperdense vessel or unexpected calcification.

Skull: Normal. Negative for fracture or focal lesion.

Sinuses/Orbits: Left sphenoid sinus retention cyst. Moderate mucosal
thickening in the posterior aspects of both frontal sinuses.
Unremarkable orbits.

Other: Mild left concha bullosa.
IMPRESSION: 1. No intracranial abnormality.
2. Bilateral frontal chronic sinusitis.

## 2023-10-11 IMAGING — MR MR HEAD W/O CM
11 series · 45 of 48 positions shown · non-contrast
Comparison: Head CT earlier same day. CT studies 03/30/2019. MRI
03/19/2018.

CLINICAL DATA: Transient ischemic attack. Right facial tingling.
Right arm paresthesias. History of hypertension.

EXAM:
MRI HEAD WITHOUT CONTRAST
TECHNIQUE: Multiplanar, multiecho pulse sequences of the brain and surrounding
structures were obtained without intravenous contrast.

[Series 5: ax dwi_tracew · axial · 3.0mm · 0.65mm/px · z∈[-90,+64]mm · 5 of 48 slices shown]
[im 1/48]
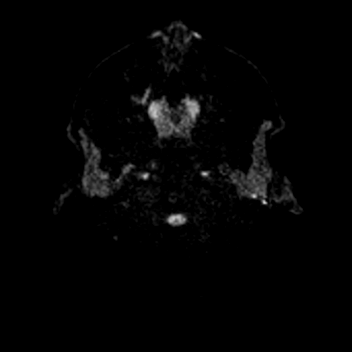
[im 12/48]
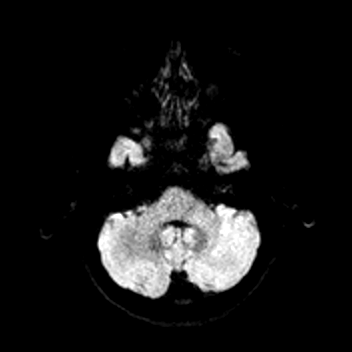
[im 24/48]
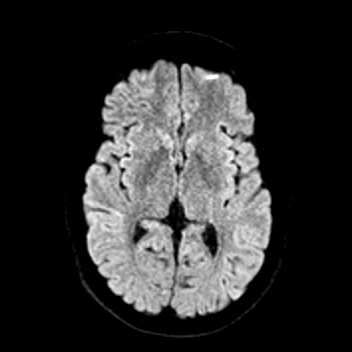
[im 36/48]
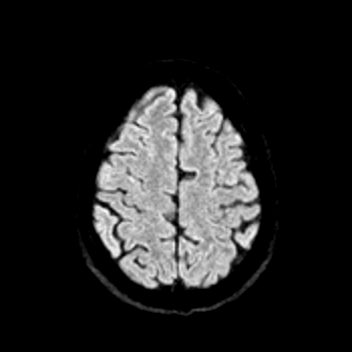
[im 48/48]
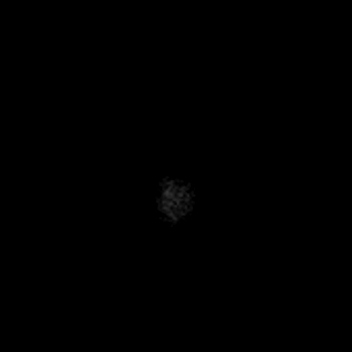

[Series 6: ax dwi_adc · axial · 3.0mm · 0.65mm/px · z∈[-90,+64]mm · 4 of 48 slices shown]
[im 1/48]
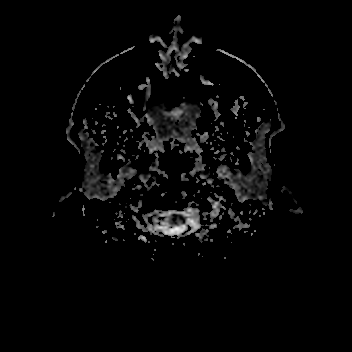
[im 16/48]
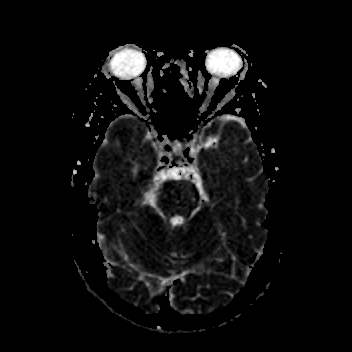
[im 32/48]
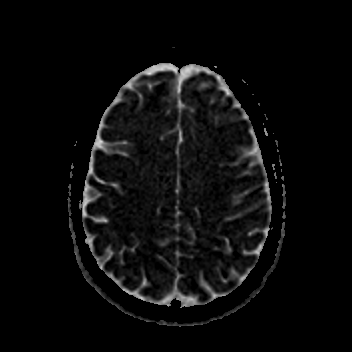
[im 48/48]
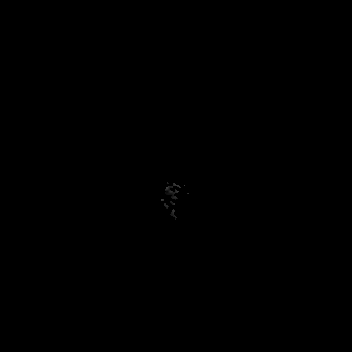

[Series 7: cor dwi_tracew · coronal · 5.0mm · 0.65mm/px · 3 of 38 slices shown]
[im 1/38]
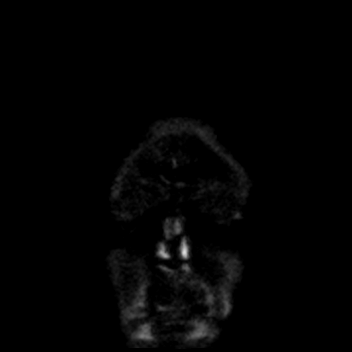
[im 19/38]
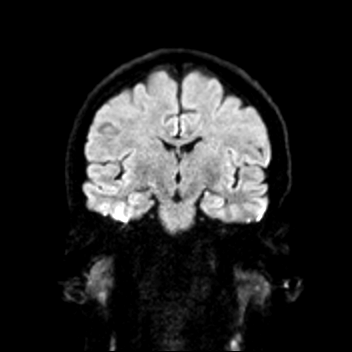
[im 38/38]
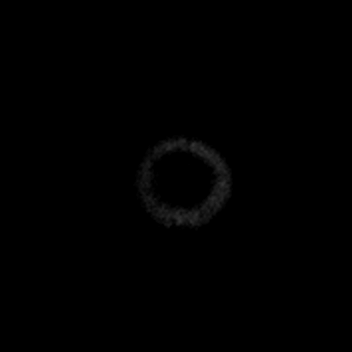

[Series 8: cor dwi_adc · coronal · 5.0mm · 0.65mm/px · 3 of 38 slices shown]
[im 1/38]
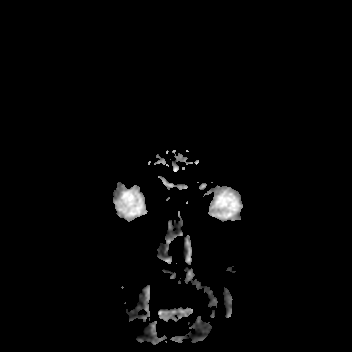
[im 19/38]
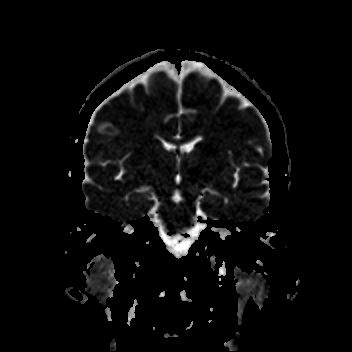
[im 38/38]
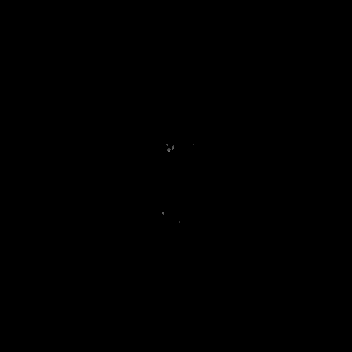

[Series 9: T1 · sagittal · 5.0mm · 0.62mm/px · 2 of 22 slices shown (1 of 2)]
[im 1/22]
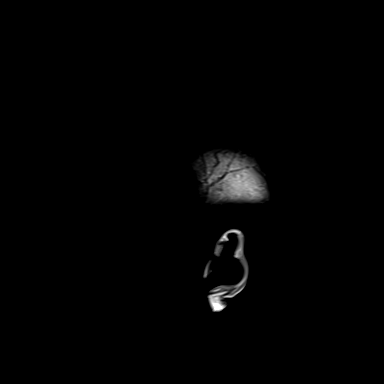
[im 22/22]
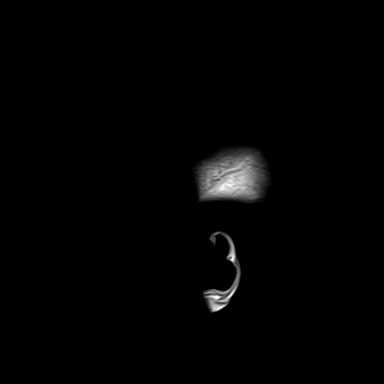

[Series 10: T2 · axial · 5.0mm · 0.53mm/px · z∈[-84,+65]mm · 2 of 26 slices shown (1 of 2)]
[im 1/26]
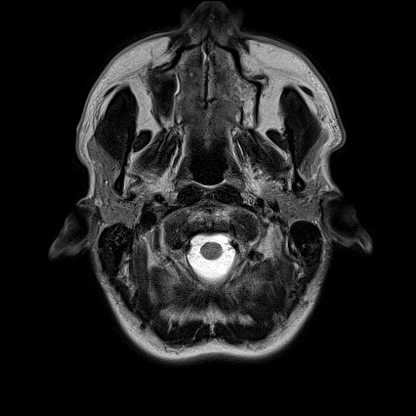
[im 26/26]
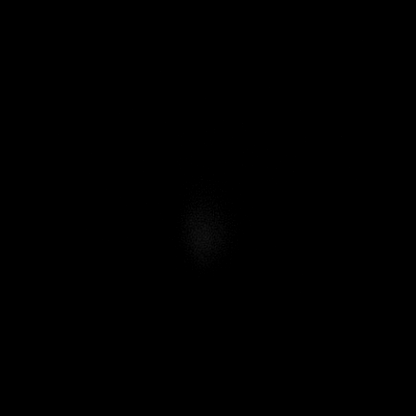

[Series 12: pha_images · axial · 3.0mm · 0.90mm/px · z∈[-95,+75]mm · 4 of 55 slices shown]
[im 1/55]
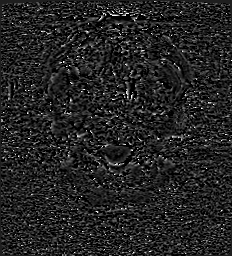
[im 19/55]
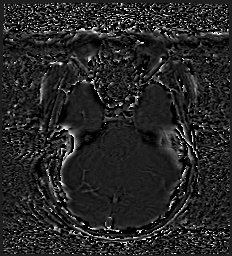
[im 37/55]
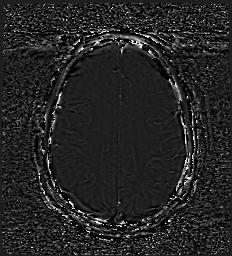
[im 55/55]
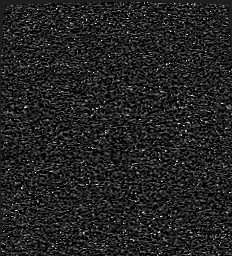

[Series 13: swi_images · axial · 3.0mm · 0.90mm/px · z∈[-98,+78]mm · 5 of 60 slices shown]
[im 1/60]
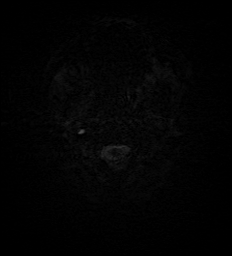
[im 15/60]
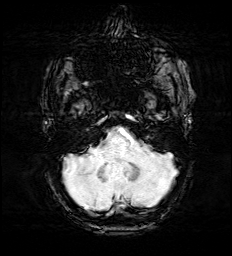
[im 30/60]
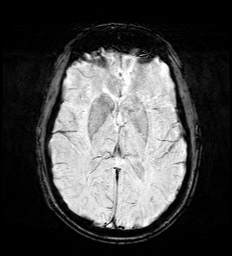
[im 45/60]
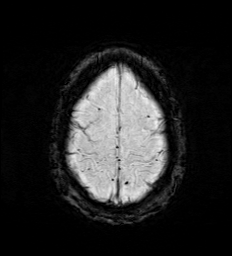
[im 60/60]
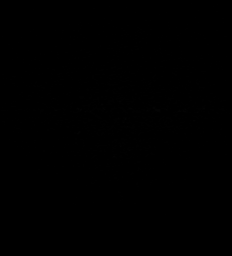

[Series 15: FLAIR · axial · 3.0mm · 0.53mm/px · z∈[-90,+71]mm · 4 of 55 slices shown]
[im 1/55]
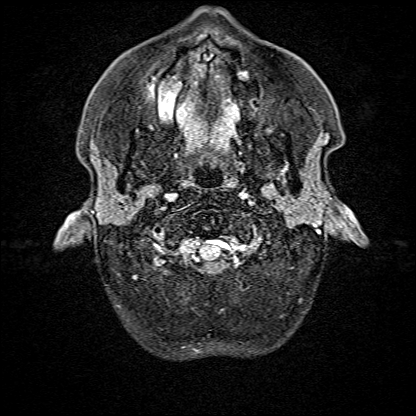
[im 19/55]
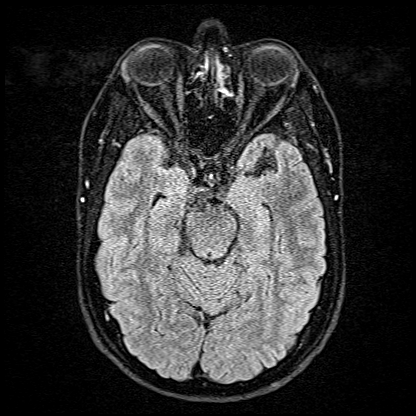
[im 37/55]
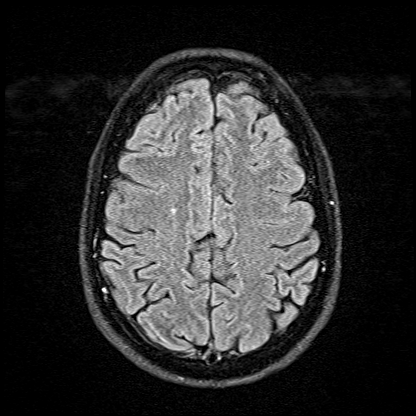
[im 55/55]
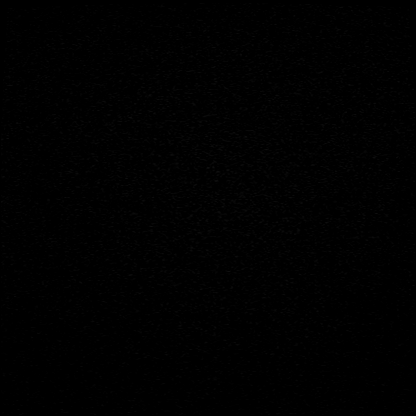

[Series 16: T1 · axial · 1.0mm · 0.98mm/px · z∈[-107,+66]mm · 11 of 173 slices shown (2 of 2)]
[im 1/173]
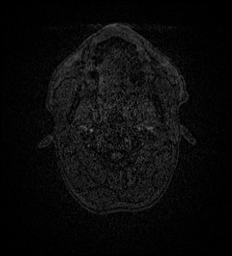
[im 14/173]
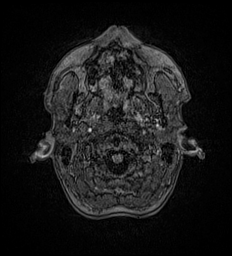
[im 27/173]
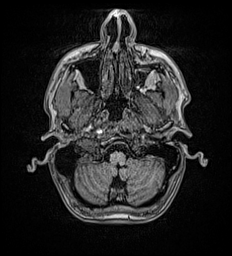
[im 40/173]
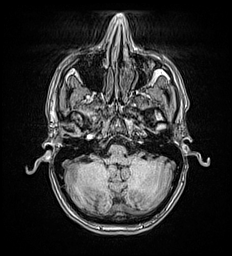
[im 53/173]
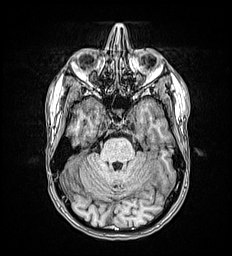
[im 67/173]
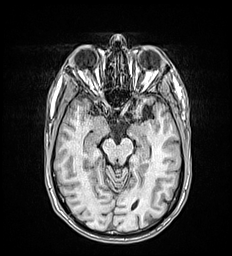
[im 80/173]
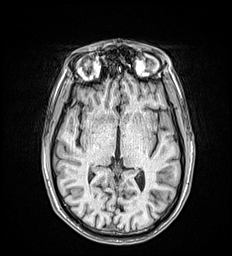
[im 93/173]
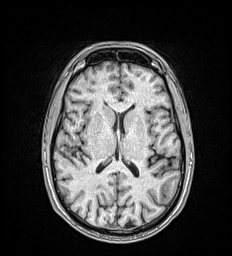
[im 120/173]
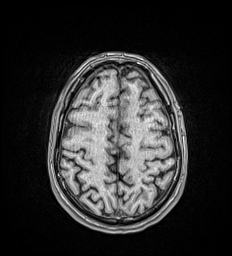
[im 146/173]
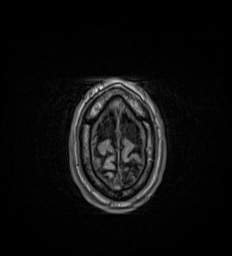
[im 173/173]
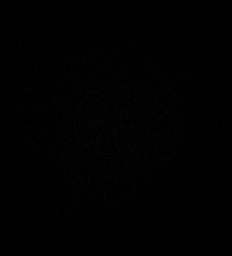

[Series 17: T2 · coronal · 5.0mm · 0.57mm/px · 2 of 29 slices shown (2 of 2)]
[im 1/29]
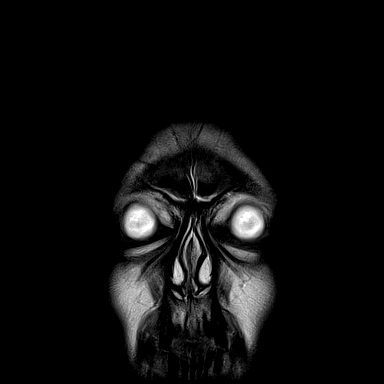
[im 29/29]
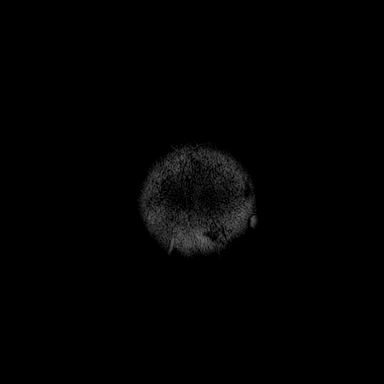

[45 of 48 positions shown; findings below may reference images not displayed]

FINDINGS: Brain: Diffusion imaging does not show any acute or subacute
infarction. The brainstem and cerebellum are normal. Left cerebral
hemisphere is normal. There is chronic encephalomalacia of the right
anterior inferior frontal lobe, presumably related to old head
trauma. This was present in 0957. There is a single punctate focus
of T2 and FLAIR signal within the right posterior frontal vertex
white matter, sequela a white matter infarction or towards acute in
Thursday March, 2018. The other acute infarction in the white matter of
the right frontal operculum is not associated with any residual MR
finding. No large vessel territory insult. No mass lesion,
hemorrhage, hydrocephalus or extra-axial collection.

Vascular: Major vessels at the base of the brain show flow.
Previously seen right ICA dissection in 0957 has resolved/healed.

Skull and upper cervical spine: Negative

Sinuses/Orbits: No acute sinusitis. Small left maxillary sinus with
mild mucosal thickening. Orbits negative.

Other: None
IMPRESSION: No acute finding.

Single punctate focus of T2 and FLAIR signal within the right
posterior frontal vertex white matter, sequela of an acute
infarction in 0957.

Chronic encephalomalacia of the anterior inferior frontal lobe on
the right, presumably related to distant head trauma.

## 2023-11-13 ENCOUNTER — Encounter
Admission: EM | Discharge status: Against Medical Advice | Payer: Self-pay | Source: Home / Self Care | Attending: Emergency Medicine

## 2023-11-13 ENCOUNTER — Emergency Department: Payer: Self-pay

## 2023-11-13 ENCOUNTER — Observation Stay
Admission: EM | Admit: 2023-11-13 | Discharge: 2023-11-13 | Discharge status: Against Medical Advice | Payer: Self-pay | Attending: Internal Medicine | Admitting: Internal Medicine

## 2023-11-13 DIAGNOSIS — Z79899 Other long term (current) drug therapy: Secondary | ICD-10-CM | POA: Insufficient documentation

## 2023-11-13 DIAGNOSIS — Z8673 Personal history of transient ischemic attack (TIA), and cerebral infarction without residual deficits: Secondary | ICD-10-CM | POA: Insufficient documentation

## 2023-11-13 DIAGNOSIS — I10 Essential (primary) hypertension: Secondary | ICD-10-CM | POA: Diagnosis present

## 2023-11-13 DIAGNOSIS — E785 Hyperlipidemia, unspecified: Secondary | ICD-10-CM | POA: Diagnosis present

## 2023-11-13 DIAGNOSIS — Z7982 Long term (current) use of aspirin: Secondary | ICD-10-CM | POA: Insufficient documentation

## 2023-11-13 DIAGNOSIS — R7989 Other specified abnormal findings of blood chemistry: Principal | ICD-10-CM | POA: Insufficient documentation

## 2023-11-13 DIAGNOSIS — F17210 Nicotine dependence, cigarettes, uncomplicated: Secondary | ICD-10-CM | POA: Insufficient documentation

## 2023-11-13 DIAGNOSIS — F1410 Cocaine abuse, uncomplicated: Secondary | ICD-10-CM | POA: Diagnosis present

## 2023-11-13 DIAGNOSIS — I214 Non-ST elevation (NSTEMI) myocardial infarction: Secondary | ICD-10-CM | POA: Diagnosis present

## 2023-11-13 DIAGNOSIS — R079 Chest pain, unspecified: Principal | ICD-10-CM | POA: Diagnosis present

## 2023-11-13 DIAGNOSIS — Z5321 Procedure and treatment not carried out due to patient leaving prior to being seen by health care provider: Secondary | ICD-10-CM | POA: Insufficient documentation

## 2023-11-13 LAB — COMPREHENSIVE METABOLIC PANEL WITH GFR
ALT: 11 U/L (ref 0–44)
AST: 28 U/L (ref 15–41)
Albumin: 3.8 g/dL (ref 3.5–5.0)
Alkaline Phosphatase: 35 U/L — ABNORMAL LOW (ref 38–126)
Anion gap: 7 (ref 5–15)
BUN: 9 mg/dL (ref 6–20)
CO2: 27 mmol/L (ref 22–32)
Calcium: 8.6 mg/dL — ABNORMAL LOW (ref 8.9–10.3)
Chloride: 104 mmol/L (ref 98–111)
Creatinine, Ser: 1.13 mg/dL (ref 0.61–1.24)
GFR, Estimated: >60 mL/min (ref >60)
Glucose, Bld: 136 mg/dL — ABNORMAL HIGH (ref 70–99)
Potassium: 3.6 mmol/L (ref 3.5–5.1)
Sodium: 138 mmol/L (ref 135–145)
Total Bilirubin: 0.4 mg/dL (ref 0.0–1.2)
Total Protein: 5.9 g/dL — ABNORMAL LOW (ref 6.5–8.1)

## 2023-11-13 LAB — CBC
HCT: 40.9 % (ref 39.0–52.0)
Hemoglobin: 14.1 g/dL (ref 13.0–17.0)
MCH: 31.5 pg (ref 26.0–34.0)
MCHC: 34.5 g/dL (ref 30.0–36.0)
MCV: 91.3 fL (ref 80.0–100.0)
Platelets: 245 10*3/uL (ref 150–400)
RBC: 4.48 MIL/uL (ref 4.22–5.81)
RDW: 13.4 % (ref 11.5–15.5)
WBC: 4.8 10*3/uL (ref 4.0–10.5)
nRBC: 0 % (ref 0.0–0.2)

## 2023-11-13 LAB — TROPONIN I (HIGH SENSITIVITY): Troponin I (High Sensitivity): 88 ng/L — ABNORMAL HIGH (ref <18)

## 2023-11-13 SURGERY — CORONARY/GRAFT ACUTE MI REVASCULARIZATION
Anesthesia: Moderate Sedation

## 2023-11-13 MED ORDER — MORPHINE SULFATE (PF) 2 MG/ML IV SOLN: 2.0000 mg | INTRAVENOUS | Status: DC | PRN

## 2023-11-13 MED ORDER — ONDANSETRON HCL 4 MG PO TABS: 4.0000 mg | ORAL_TABLET | Freq: Four times a day (QID) | ORAL | Status: DC | PRN

## 2023-11-13 MED ORDER — HYDROCODONE-ACETAMINOPHEN 5-325 MG PO TABS: 1.0000 | ORAL_TABLET | ORAL | Status: DC | PRN

## 2023-11-13 MED ORDER — ACETAMINOPHEN 650 MG RE SUPP: 650.0000 mg | Freq: Four times a day (QID) | RECTAL | Status: DC | PRN

## 2023-11-13 MED ORDER — ASPIRIN 81 MG PO TBEC
81.0000 mg | DELAYED_RELEASE_TABLET | Freq: Every day | ORAL | Status: DC
  Administered 2023-11-13: 81 mg via ORAL
  Filled 2023-11-13: qty 1

## 2023-11-13 MED ORDER — ENOXAPARIN SODIUM 40 MG/0.4ML IJ SOSY
40.0000 mg | PREFILLED_SYRINGE | INTRAMUSCULAR | Status: DC
  Administered 2023-11-13: 40 mg via SUBCUTANEOUS
  Filled 2023-11-13: qty 0.4

## 2023-11-13 MED ORDER — ACETAMINOPHEN 325 MG PO TABS: 650.0000 mg | ORAL_TABLET | Freq: Four times a day (QID) | ORAL | Status: DC | PRN

## 2023-11-13 MED ORDER — ONDANSETRON HCL 4 MG/2ML IJ SOLN
4.0000 mg | Freq: Four times a day (QID) | INTRAMUSCULAR | Status: DC | PRN
Start: 2023-11-13 — End: 2023-11-13

## 2023-11-13 NOTE — H&P (Signed)
 History and Physical    Patient: Robert Jacobson:[id number] DOB: 12-19-1975 DOA: 11/13/2023 DOS: the patient was seen and examined on 11/13/2023 PCP: Pcp, No  Patient coming from: Home  Chief Complaint: Chest pain, substernal with SOB, anxiety.  Chief Complaint  Patient presents with   Code STEMI   HPI: Robert Jacobson is a 48 y.o. male with medical history significant of Cocaine abuse,  hypertension, history of CVA, SAH. He presented to [facility] ED on 11/13/2023 with complaints of Chest pain with SOB and anxiety. Code STEMI initially called based upon EKG, although it was cancelled after it was compared to previous EKG. The EKG was viewed by cardiology who concurred that this was not a STEMI. Troponin was 88.   UDS is pending.  Patient admitted to substernal chest pain, shortness of breath. He denies cough, wheeze, diaphoresis, nausea. He denies fevers, chills, sputum production. No nausea, vomiting, or diarrhea. No hematemesis, hematochezia, or melena. No neurological changes, no mental status changes, no visual changes. No sores, lesions, or rashes.  The patient will be admitted to observation status to follow his troponins and to monitor him on telemetry.  Review of Systems: As mentioned in the history of present illness. All other systems reviewed and are negative. Past Medical History:  Diagnosis Date   Acute ischemic stroke (HCC) 03/18/2018   Hypertension    Stroke (HCC)    Subarachnoid hemorrhage (HCC) 04/01/2018   Noted during 03/28/18   Past Surgical History:  Procedure Laterality Date   IR ANGIO INTRA EXTRACRAN SEL COM CAROTID INNOMINATE BILAT MOD SED  03/18/2018   IR ANGIO VERTEBRAL SEL VERTEBRAL BILAT MOD SED  03/18/2018   RADIOLOGY WITH ANESTHESIA N/A 03/18/2018   Procedure: IR WITH ANESTHESIA;  Surgeon: Radiologist, Medication, MD;  Location: [facility] OR;  Service: Radiology;  Laterality: N/A;   Social History:  reports that he has been smoking cigarettes. He has  a 25 pack-year smoking history. He has never used smokeless tobacco. He reports current alcohol use of about 35.0 standard drinks of alcohol per week. He reports current drug use. Drugs: Cocaine and Marijuana.  No Known Allergies  Family History  Problem Relation Age of Onset   Hypertension Mother    Hypertension Father    Glaucoma Father    Stroke Maternal Grandmother    Cerebral aneurysm Maternal Grandfather     Prior to Admission medications   Medication Sig Start Date End Date Taking? Authorizing Provider  amLODipine  (NORVASC ) 10 MG tablet Take 1 tablet (10 mg total) by mouth daily. 06/26/23 06/25/24 Yes Wouk, Haynes Lips, MD  aspirin  EC 81 MG tablet Take 1 tablet (81 mg total) by mouth daily. Take after 12 weeks for prevention of preeclampsia later in pregnancy 06/26/23  Yes Wouk, Haynes Lips, MD  atorvastatin  (LIPITOR) 40 MG tablet Take 1 tablet (40 mg total) by mouth daily. 06/26/23  Yes Wouk, Haynes Lips, MD  hydrOXYzine  (ATARAX ) 25 MG tablet Take 1 tablet (25 mg total) by mouth at bedtime as needed (sleep). 06/26/23  Yes Janeane Mealy, MD    Physical Exam: Vitals:   11/13/23 0709 11/13/23 0711 11/13/23 1045 11/13/23 1113  BP:  113/84  (!) 98/57  Pulse:  81 83   Resp:  20 19   Temp:  97.8 F (36.6 C)    TempSrc:  Oral    SpO2:  98% 99%   Weight: 68 kg     Height: 5\' 9"  (1.753 m)      Exam:  Constitutional:  The patient is awake, alert, and oriented x 3. No acute distress. Eyes:  pupils and irises appear normal Normal lids and conjunctivae ENMT:  grossly normal hearing  Lips appear normal external ears, nose appear normal Oropharynx: mucosa, tongue,posterior pharynx appear normal Neck:  neck appears normal, no masses, normal ROM, supple no thyromegaly Respiratory:  No increased work of breathing. No wheezes, rales, or rhonchi No tactile fremitus Cardiovascular:  Regular rate and rhythm No murmurs, ectopy, or gallups. No lateral PMI. No  thrills. Abdomen:  Abdomen is soft, non-tender, non-distended No hernias, masses, or organomegaly Normoactive bowel sounds.  Musculoskeletal:  No cyanosis, clubbing, or edema Skin:  No rashes, lesions, ulcers palpation of skin: no induration or nodules Neurologic:  CN 2-12 intact Sensation all 4 extremities intact Psychiatric:  Mental status Mood, affect appropriate Orientation to person, place, time  judgment and insight appear intact  Data Reviewed:  EKG CBC BMP  Assessment and Plan: Chest pain EKG unchanged from previous EKG. No STEMI. Troponin was elevated at 88.Will monitor the patient on telemetry and follow EKG and troponin. Chest pain is likely due to cocaine use. UDS is pending.  NSTEMI (non-ST elevated myocardial infarction) (HCC) NSTEMI with elevated troponin of 88. Monitor on telemetry. Follow troponins.  Essential (primary) hypertension Continue amlodipine  as outpatient.  Cocaine abuse (HCC) Chest pain is likely due to cocaine use. Patient is strongly urged to cease using cocaine or illicit substances.  Hyperlipidemia Continue statin as outpatient. Check lipid panel in the am.  I have seen and examined this patient myself. I have spent 56 minutes in his evaluation and admission.   Advance Care Planning:   Code Status: Full Code   Consults: Cardiology - Consulted from ED. Determined no STEMI  Family Communication: None available  Severity of Illness: The appropriate patient status for this patient is OBSERVATION. Observation status is judged to be reasonable and necessary in order to provide the required intensity of service to ensure the patient's safety. The patient's presenting symptoms, physical exam findings, and initial radiographic and laboratory data in the context of their medical condition is felt to place them at decreased risk for further clinical deterioration. Furthermore, it is anticipated that the patient will be medically stable for  discharge from the hospital within 2 midnights of admission.   Author: Maleya Leever, DO 11/13/2023 12:09 PM  For on call review [email or web address].

## 2023-11-13 NOTE — Assessment & Plan Note (Signed)
 Continue statin as outpatient. Check lipid panel in the am.

## 2023-11-13 NOTE — Assessment & Plan Note (Signed)
 EKG unchanged from previous EKG. No STEMI. Troponin was elevated at 88.Will monitor the patient on telemetry and follow EKG and troponin. Chest pain is likely due to cocaine use. UDS is pending.

## 2023-11-13 NOTE — Assessment & Plan Note (Signed)
 Chest pain is likely due to cocaine use. Patient is strongly urged to cease using cocaine or illicit substances.

## 2023-11-13 NOTE — ED Provider Notes (Signed)
 [facility] Provider Note    Event Date/Time   First MD Initiated Contact with Patient 11/13/23 [phone]     (approximate)   History   Code STEMI   HPI  Robert Jacobson is a 48 y.o. male with reported history of prior CVA, hypertension, substance abuse who presents with chest pain.  EMS reports called out for chest pain this morning, they found patient be quite anxious complaining of chest pain.  Initial EKG appeared abnormal and so EMS activated code STEMI.  After evaluating the patient comparing EKG to patient's old EKGs code STEMI canceled by me     Physical Exam   Triage Vital Signs: ED Triage Vitals [11/13/23 0709]  Encounter Vitals Group     BP      Systolic BP Percentile      Diastolic BP Percentile      Pulse      Resp      Temp      Temp src      SpO2      Weight 68 kg (150 lb)     Height 1.753 m (5\' 9" )     Head Circumference      Peak Flow      Pain Score 0     Pain Loc      Pain Education      Exclude from Growth Chart     Most recent vital signs: Vitals:   11/13/23 0711  BP: 113/84  Pulse: 81  Resp: 20  Temp: 97.8 F (36.6 C)  SpO2: 98%     General: Awake, no distress.  CV:  Good peripheral perfusion.  Regular rate and rhythm Resp:  Normal effort.  Clear to auscultation bilaterally Abd:  No distention.  Other:     ED Results / Procedures / Treatments   Labs (all labs ordered are listed, but only abnormal results are displayed) Labs Reviewed  COMPREHENSIVE METABOLIC PANEL WITH GFR - Abnormal; Notable for the following components:      Result Value   Glucose, Bld 136 (*)    Calcium  8.6 (*)    Total Protein 5.9 (*)    Alkaline Phosphatase 35 (*)    All other components within normal limits  TROPONIN I (HIGH SENSITIVITY) - Abnormal; Notable for the following components:   Troponin I (High Sensitivity) 88 (*)    All other components within normal limits  CBC  URINE DRUG SCREEN, QUALITATIVE ([facility] ONLY)      EKG  ED ECG REPORT I, Bryson Carbine, the attending physician, personally viewed and interpreted this ECG.  Date: 11/13/2023  Rhythm: normal sinus rhythm QRS Axis: normal Intervals: normal ST/T Wave abnormalities: normal Narrative Interpretation: no evidence of acute ischemia    RADIOLOGY Chest x-ray with no acute abnormality    PROCEDURES:  Critical Care performed: yes  CRITICAL CARE Performed by: Bryson Carbine   Total critical care time: 30 minutes  Critical care time was exclusive of separately billable procedures and treating other patients.  Critical care was necessary to treat or prevent imminent or life-threatening deterioration.  Critical care was time spent personally by me on the following activities: development of treatment plan with patient and/or surrogate as well as nursing, discussions with consultants, evaluation of patient's response to treatment, examination of patient, obtaining history from patient or surrogate, ordering and performing treatments and interventions, ordering and review of laboratory studies, ordering and review of radiographic studies, pulse oximetry and re-evaluation of patient's condition.  Procedures   MEDICATIONS ORDERED IN ED: Medications - No data to display   IMPRESSION / MDM / ASSESSMENT AND PLAN / ED COURSE  I reviewed the triage vital signs and the nursing notes. Patient's presentation is most consistent with acute presentation with potential threat to life or bodily function.   Patient presents with chest pain in the setting of history of hypertension, substance abuse, differential includes ACS/NSTEMI, GERD, anxiety  Initial EKG here is reassuring, not consistent with ST elevation MI, will obtain troponins, chest x-ray placed on cardiac monitor and monitor carefully.  Patient's troponin is mildly elevated at 88, he remains chest pain-free, he has received aspirin .  He does admit to cocaine use last night.   Will consult the hospitalist team for admission       FINAL CLINICAL IMPRESSION(S) / ED DIAGNOSES   Final diagnoses:  Elevated troponin  Chest pain, unspecified type     Rx / DC Orders   ED Discharge Orders     None        Note:  This document was prepared using Dragon voice recognition software and may include unintentional dictation errors.   Bryson Carbine, MD 11/13/23 [phone]

## 2023-11-13 NOTE — ED Triage Notes (Signed)
 Pt arrives via [facility] from home for L sided CP that woke him from sleep. Pt denies SOB, N/V, diaphoresis. Per EMS, slight ST elevation in V2, V3 without reciprocal change, thus Code STEMI activated. Pt given 324 mg ASA, no NTG with EMS. Dr. Martina Sledge at bedside, canceled Code Stemi.

## 2023-11-13 NOTE — Progress Notes (Signed)
   11/13/23 0800  Spiritual Encounters  Type of Visit Initial  Care provided to: Patient  Referral source Code page  Reason for visit Code  OnCall Visit Yes  Interventions  Spiritual Care Interventions Made Established relationship of care and support;Compassionate presence  Intervention Outcomes  Outcomes Connection to spiritual care   Chaplain encourage patient and provided compassionate presence.

## 2023-11-13 NOTE — Assessment & Plan Note (Signed)
 Continue amlodipine  as outpatient.

## 2023-11-13 NOTE — Assessment & Plan Note (Signed)
 NSTEMI with elevated troponin of 88. Monitor on telemetry. Follow troponins.

## 2023-11-13 NOTE — Progress Notes (Signed)
 Patient leaving AMA. Provider made aware.

## 2024-02-20 ENCOUNTER — Emergency Department
Admission: EM | Admit: 2024-02-20 | Discharge: 2024-02-20 | Discharge status: Against Medical Advice | Payer: Self-pay | Attending: Emergency Medicine | Admitting: Emergency Medicine

## 2024-02-20 ENCOUNTER — Other Ambulatory Visit: Payer: Self-pay

## 2024-02-20 DIAGNOSIS — H5711 Ocular pain, right eye: Secondary | ICD-10-CM | POA: Insufficient documentation

## 2024-02-20 DIAGNOSIS — F1290 Cannabis use, unspecified, uncomplicated: Secondary | ICD-10-CM | POA: Insufficient documentation

## 2024-02-20 DIAGNOSIS — Z5321 Procedure and treatment not carried out due to patient leaving prior to being seen by health care provider: Secondary | ICD-10-CM | POA: Insufficient documentation

## 2024-02-20 DIAGNOSIS — H5712 Ocular pain, left eye: Secondary | ICD-10-CM | POA: Insufficient documentation

## 2024-02-20 NOTE — ED Notes (Signed)
 [employer] into speak with patient; as they are doing so, patient walks out of ER, does not say anything to this RN at that time.  Ambulatory without difficulty at time of departure.
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
